# Patient Record
Sex: Female | Born: 1975 | Race: White | Hispanic: No | Marital: Married | Smoking: Never smoker
Health system: Southern US, Community
[De-identification: ages and names within clinical notes are randomized; demographics above are authoritative.]

## PROBLEM LIST (undated history)

## (undated) ENCOUNTER — Inpatient Hospital Stay (HOSPITAL_COMMUNITY): Payer: Self-pay

## (undated) DIAGNOSIS — R51 Headache: Secondary | ICD-10-CM

## (undated) DIAGNOSIS — T7840XA Allergy, unspecified, initial encounter: Secondary | ICD-10-CM

## (undated) DIAGNOSIS — M1990 Unspecified osteoarthritis, unspecified site: Secondary | ICD-10-CM

## (undated) DIAGNOSIS — I10 Essential (primary) hypertension: Secondary | ICD-10-CM

## (undated) DIAGNOSIS — I2699 Other pulmonary embolism without acute cor pulmonale: Secondary | ICD-10-CM

## (undated) DIAGNOSIS — F419 Anxiety disorder, unspecified: Secondary | ICD-10-CM

## (undated) DIAGNOSIS — D689 Coagulation defect, unspecified: Secondary | ICD-10-CM

## (undated) DIAGNOSIS — O09529 Supervision of elderly multigravida, unspecified trimester: Secondary | ICD-10-CM

## (undated) DIAGNOSIS — A630 Anogenital (venereal) warts: Secondary | ICD-10-CM

## (undated) DIAGNOSIS — Z1589 Genetic susceptibility to other disease: Secondary | ICD-10-CM

## (undated) DIAGNOSIS — K219 Gastro-esophageal reflux disease without esophagitis: Secondary | ICD-10-CM

## (undated) DIAGNOSIS — E7212 Methylenetetrahydrofolate reductase deficiency: Secondary | ICD-10-CM

## (undated) DIAGNOSIS — K644 Residual hemorrhoidal skin tags: Secondary | ICD-10-CM

## (undated) DIAGNOSIS — F329 Major depressive disorder, single episode, unspecified: Secondary | ICD-10-CM

## (undated) DIAGNOSIS — F32A Depression, unspecified: Secondary | ICD-10-CM

## (undated) HISTORY — DX: Anxiety disorder, unspecified: F41.9

## (undated) HISTORY — DX: Major depressive disorder, single episode, unspecified: F32.9

## (undated) HISTORY — DX: Coagulation defect, unspecified: D68.9

## (undated) HISTORY — DX: Depression, unspecified: F32.A

## (undated) HISTORY — DX: Residual hemorrhoidal skin tags: K64.4

## (undated) HISTORY — PX: WISDOM TOOTH EXTRACTION: SHX21

## (undated) HISTORY — DX: Essential (primary) hypertension: I10

## (undated) HISTORY — DX: Allergy, unspecified, initial encounter: T78.40XA

## (undated) HISTORY — DX: Genetic susceptibility to other disease: Z15.89

## (undated) HISTORY — DX: Unspecified osteoarthritis, unspecified site: M19.90

## (undated) HISTORY — DX: Methylenetetrahydrofolate reductase deficiency: E72.12

## (undated) HISTORY — DX: Anogenital (venereal) warts: A63.0

## (undated) HISTORY — DX: Supervision of elderly multigravida, unspecified trimester: O09.529

---

## 1999-06-13 ENCOUNTER — Other Ambulatory Visit: Admission: RE | Admit: 1999-06-13 | Discharge: 1999-06-13 | Payer: Self-pay | Admitting: *Deleted

## 1999-07-20 ENCOUNTER — Encounter (INDEPENDENT_AMBULATORY_CARE_PROVIDER_SITE_OTHER): Payer: Self-pay | Admitting: Specialist

## 1999-07-20 ENCOUNTER — Other Ambulatory Visit: Admission: RE | Admit: 1999-07-20 | Discharge: 1999-07-20 | Payer: Self-pay | Admitting: *Deleted

## 1999-11-22 ENCOUNTER — Encounter: Admission: RE | Admit: 1999-11-22 | Discharge: 1999-11-22 | Payer: Self-pay | Admitting: Urology

## 1999-11-22 ENCOUNTER — Encounter: Payer: Self-pay | Admitting: Urology

## 2000-01-23 ENCOUNTER — Other Ambulatory Visit: Admission: RE | Admit: 2000-01-23 | Discharge: 2000-01-23 | Payer: Self-pay | Admitting: *Deleted

## 2000-06-13 ENCOUNTER — Other Ambulatory Visit: Admission: RE | Admit: 2000-06-13 | Discharge: 2000-06-13 | Payer: Self-pay | Admitting: *Deleted

## 2000-10-08 ENCOUNTER — Other Ambulatory Visit: Admission: RE | Admit: 2000-10-08 | Discharge: 2000-10-08 | Payer: Self-pay | Admitting: Obstetrics and Gynecology

## 2001-06-17 ENCOUNTER — Other Ambulatory Visit: Admission: RE | Admit: 2001-06-17 | Discharge: 2001-06-17 | Payer: Self-pay | Admitting: Obstetrics and Gynecology

## 2002-06-19 ENCOUNTER — Other Ambulatory Visit: Admission: RE | Admit: 2002-06-19 | Discharge: 2002-06-19 | Payer: Self-pay | Admitting: Obstetrics and Gynecology

## 2003-02-27 DIAGNOSIS — I2699 Other pulmonary embolism without acute cor pulmonale: Secondary | ICD-10-CM

## 2003-02-27 HISTORY — DX: Other pulmonary embolism without acute cor pulmonale: I26.99

## 2003-05-03 ENCOUNTER — Inpatient Hospital Stay (HOSPITAL_COMMUNITY): Admission: EM | Admit: 2003-05-03 | Discharge: 2003-05-05 | Payer: Self-pay | Admitting: Internal Medicine

## 2003-06-23 ENCOUNTER — Other Ambulatory Visit: Admission: RE | Admit: 2003-06-23 | Discharge: 2003-06-23 | Payer: Self-pay | Admitting: Obstetrics and Gynecology

## 2005-01-11 IMAGING — CT CT CHEST W/ CM
1 of 3 series · 15 of 30 positions shown, 19 images · IV contrast (omnipaque)
Comparison: none

CLINICAL DATA: Chest pain and shortness of breath.  Evaluate for pulmonary embolism.
 CT CHEST WITH CONTRAST 
 Multidetector helical CT of the chest was performed following the pulmonary embolism protocol.  150 cc of Omnipaque 300 intravenous contrast was administered.  
 Satisfactory opacification of the pulmonary arteries is seen, and there is no evidence of acute pulmonary embolism.  The other hilar and mediastinal structures are unremarkable.  There is no evidence of masses or adenopathy.  There is no evidence of thoracic aortic aneurysm or dissection.  
 A tiny right pleural effusion is seen.  There is also mild dependent atelectasis in the right lower lobe.  There is no evidence of pulmonary airspace disease.  There is no evidence of mass or suspicious pulmonary nodules. 
 IMPRESSION
 1.  No CT evidence of acute pulmonary embolism. 
 2.  Tiny right pleural effusion and mild dependent right lower lobe atelectasis.

[Series 4: chest/pe 1.0 b10f · axial · 0.60mm/px · z∈[-312,-111]mm · 15 of 227 slices shown, 19 images]
[im 13/227  mediastinal]
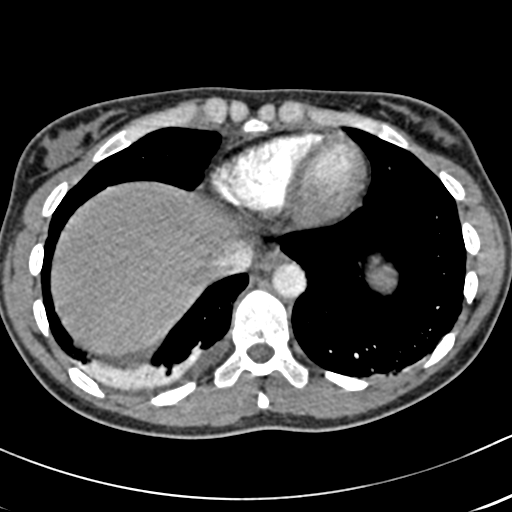
[im 13/227  lung]
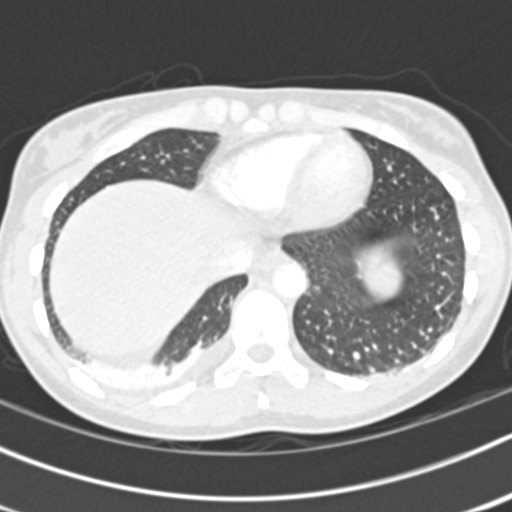
[im 26/227  lung]
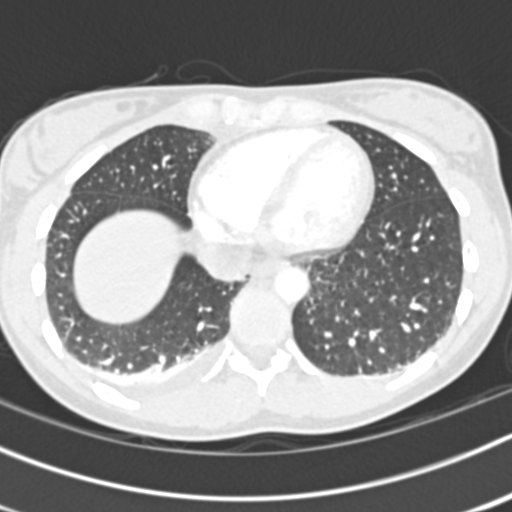
[im 51/227  lung]
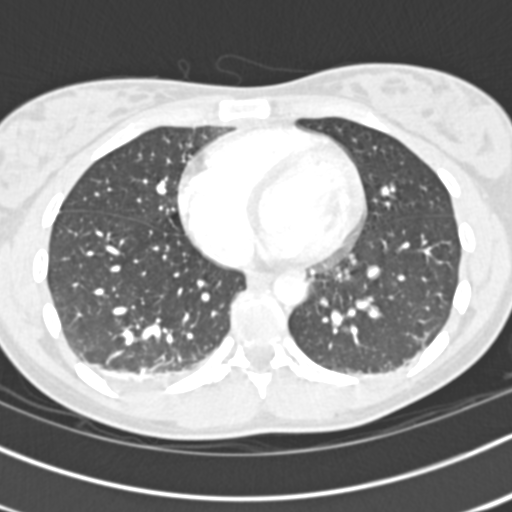
[im 52/227  lung]
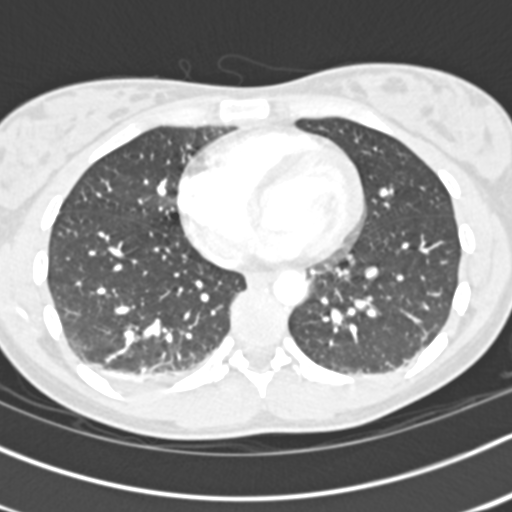
[im 63/227  mediastinal]
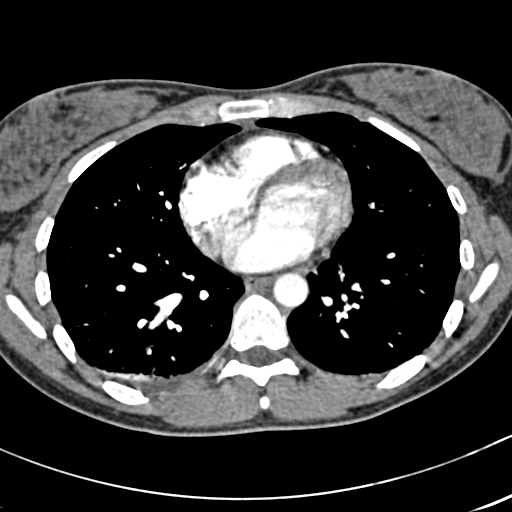
[im 63/227  lung]
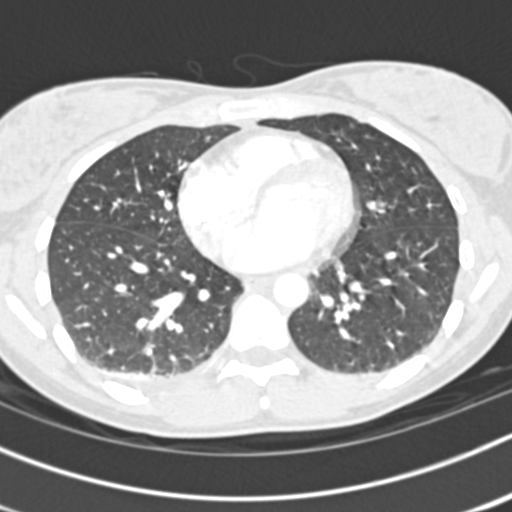
[im 76/227  lung]
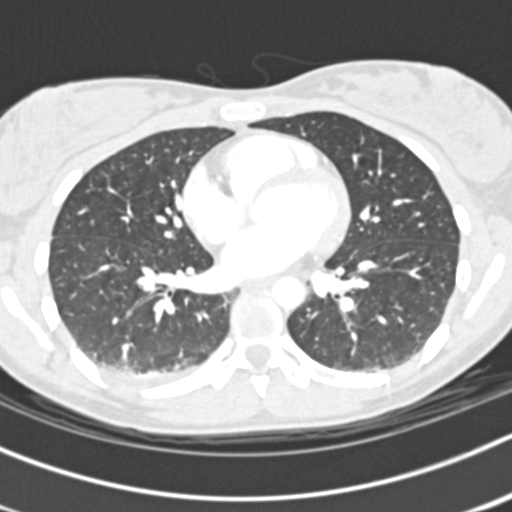
[im 88/227  lung]
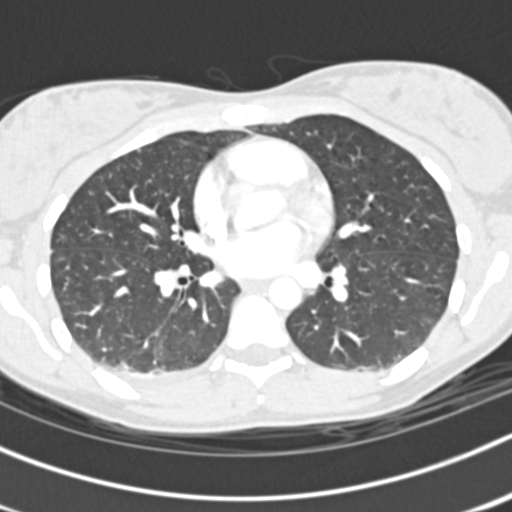
[im 114/227  lung]
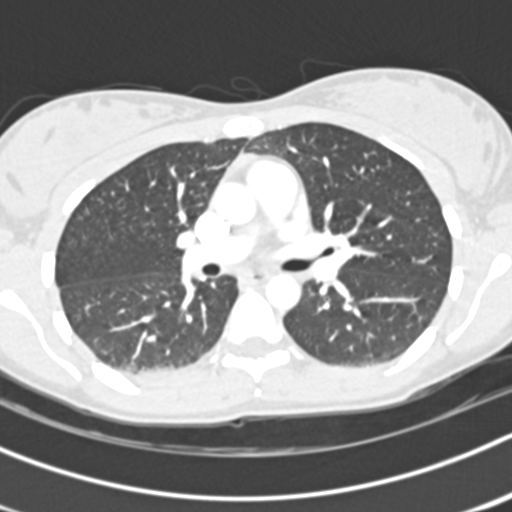
[im 126/227  mediastinal]
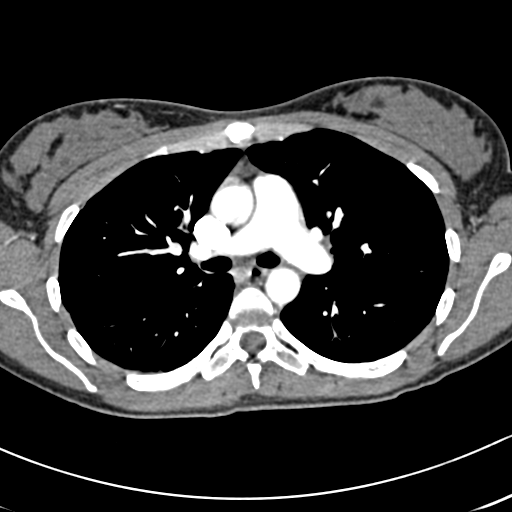
[im 126/227  lung]
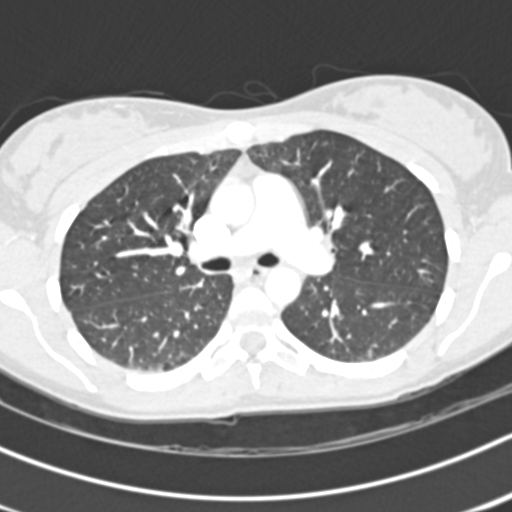
[im 139/227  lung]
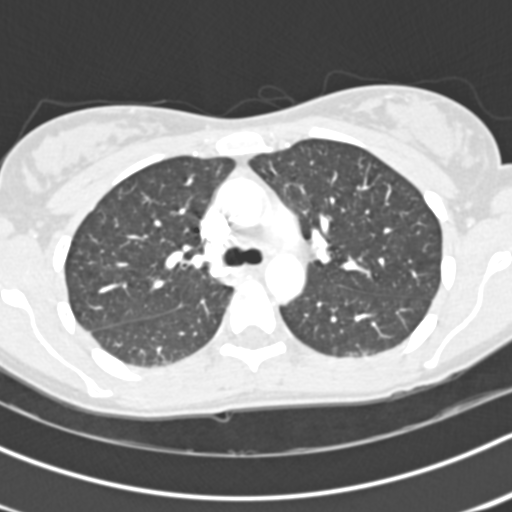
[im 151/227  lung]
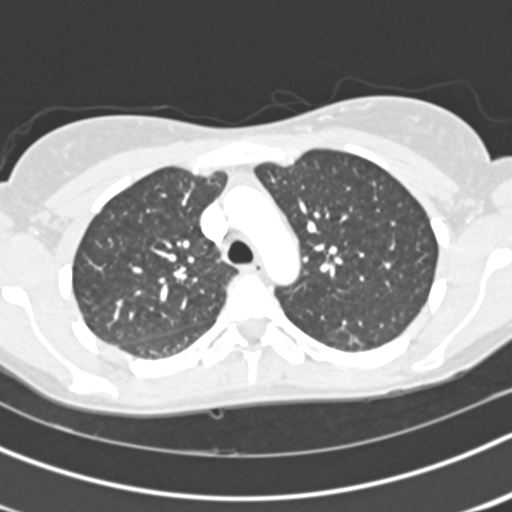
[im 164/227  lung]
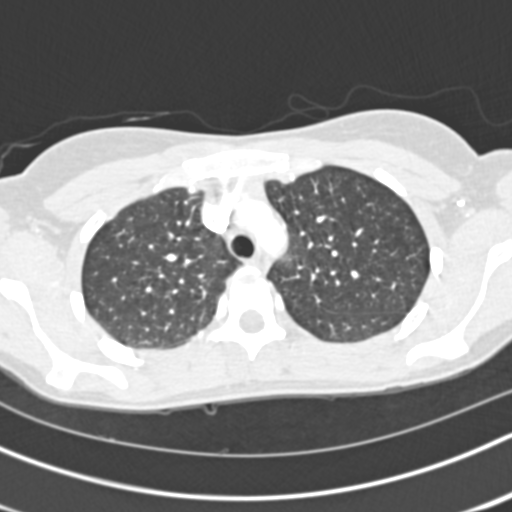
[im 189/227  mediastinal]
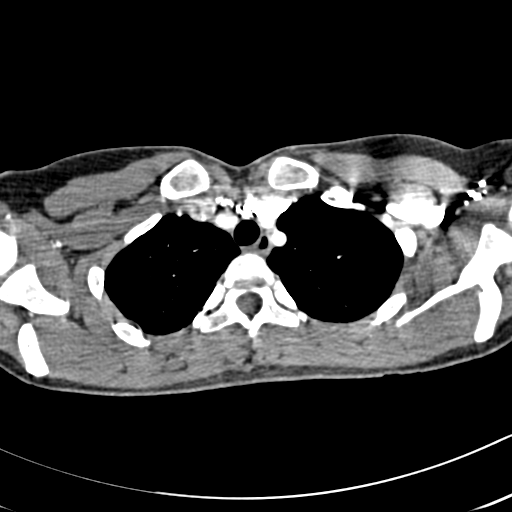
[im 189/227  lung]
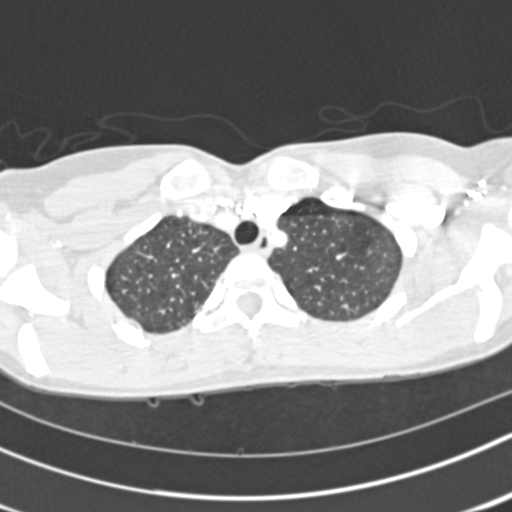
[im 201/227  lung]
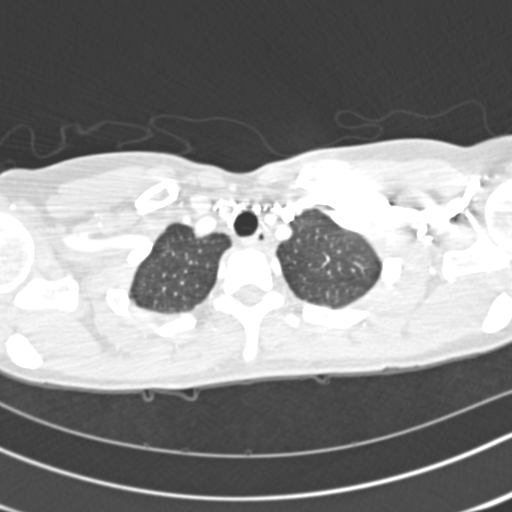
[im 214/227  lung]
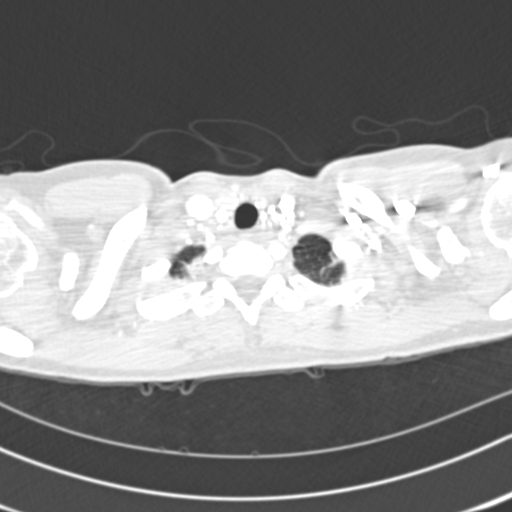

[15 of 30 positions shown; findings below may reference images not displayed]

## 2006-04-24 ENCOUNTER — Ambulatory Visit: Payer: Self-pay | Admitting: Oncology

## 2006-04-30 LAB — CBC WITH DIFFERENTIAL/PLATELET
BASO%: 0.7 % (ref 0.0–2.0)
EOS%: 0.7 % (ref 0.0–7.0)
HCT: 38.1 % (ref 34.8–46.6)
LYMPH%: 27.9 % (ref 14.0–48.0)
MCH: 31.7 pg (ref 26.0–34.0)
MCHC: 35.2 g/dL (ref 32.0–36.0)
NEUT%: 63.1 % (ref 39.6–76.8)
Platelets: 239 10*3/uL (ref 145–400)
RBC: 4.24 10*6/uL (ref 3.70–5.32)

## 2006-04-30 LAB — MORPHOLOGY: RBC Comments: NORMAL

## 2006-05-03 LAB — BETA-2 GLYCOPROTEIN ANTIBODIES: Beta-2-Glycoprotein I IgA: 7 U/mL (ref <10)

## 2006-05-03 LAB — APTT: aPTT: 29 seconds (ref 24–37)

## 2006-05-03 LAB — COMPREHENSIVE METABOLIC PANEL
BUN: 15 mg/dL (ref 6–23)
CO2: 24 mEq/L (ref 19–32)
Creatinine, Ser: 0.92 mg/dL (ref 0.40–1.20)
Glucose, Bld: 101 mg/dL — ABNORMAL HIGH (ref 70–99)
Total Bilirubin: 0.4 mg/dL (ref 0.3–1.2)

## 2006-05-03 LAB — CARDIOLIPIN ANTIBODIES, IGG, IGM, IGA: Anticardiolipin IgA: 8 [APL'U] (ref <13)

## 2006-05-03 LAB — LUPUS ANTICOAGULANT PANEL
DRVVT: 38.9 secs (ref 36.1–47.0)
PTT Lupus Anticoagulant: 47.4 secs (ref 36.3–48.8)

## 2006-05-03 LAB — LACTATE DEHYDROGENASE: LDH: 109 U/L (ref 94–250)

## 2006-05-03 LAB — FIBRINOGEN: Fibrinogen: 311 mg/dL (ref 204–475)

## 2006-05-03 LAB — PROTHROMBIN TIME
INR: 1 (ref 0.0–1.5)
Prothrombin Time: 13 seconds (ref 11.6–15.2)

## 2006-05-03 LAB — D-DIMER, QUANTITATIVE: D-Dimer, Quant: 0.37 ug/mL-FEU (ref 0.00–0.48)

## 2006-10-20 ENCOUNTER — Ambulatory Visit: Payer: Self-pay | Admitting: Oncology

## 2006-10-22 LAB — CBC WITH DIFFERENTIAL/PLATELET
Eosinophils Absolute: 0.1 10*3/uL (ref 0.0–0.5)
LYMPH%: 25.4 % (ref 14.0–48.0)
MONO#: 0.6 10*3/uL (ref 0.1–0.9)
NEUT#: 4.9 10*3/uL (ref 1.5–6.5)
Platelets: 272 10*3/uL (ref 145–400)
RBC: 4.07 10*6/uL (ref 3.70–5.32)
WBC: 7.5 10*3/uL (ref 3.9–10.0)

## 2006-10-22 LAB — HEPARIN ANTI-XA: Heparin LMW: <0.1 IU/mL

## 2007-03-31 ENCOUNTER — Ambulatory Visit: Payer: Self-pay | Admitting: Oncology

## 2007-04-02 LAB — CBC WITH DIFFERENTIAL/PLATELET
BASO%: 0.4 % (ref 0.0–2.0)
Basophils Absolute: 0 10*3/uL (ref 0.0–0.1)
EOS%: 1 % (ref 0.0–7.0)
HGB: 12.6 g/dL (ref 11.6–15.9)
MCH: 31.4 pg (ref 26.0–34.0)
MCHC: 34.2 g/dL (ref 32.0–36.0)
MCV: 91.8 fL (ref 81.0–101.0)
MONO%: 7.3 % (ref 0.0–13.0)
RDW: 13.3 % (ref 11.3–14.5)
lymph#: 1.6 10*3/uL (ref 0.9–3.3)

## 2007-04-02 LAB — HEPARIN ANTI-XA: Heparin LMW: <0.1 IU/mL

## 2007-05-24 ENCOUNTER — Inpatient Hospital Stay (HOSPITAL_COMMUNITY): Admission: AD | Admit: 2007-05-24 | Discharge: 2007-05-28 | Payer: Self-pay | Admitting: Obstetrics and Gynecology

## 2007-05-25 ENCOUNTER — Ambulatory Visit: Payer: Self-pay | Admitting: Oncology

## 2007-06-06 ENCOUNTER — Ambulatory Visit: Payer: Self-pay | Admitting: Oncology

## 2009-07-21 ENCOUNTER — Ambulatory Visit: Payer: Self-pay | Admitting: Oncology

## 2009-07-22 LAB — CBC WITH DIFFERENTIAL/PLATELET
BASO%: 0.6 % (ref 0.0–2.0)
Basophils Absolute: 0 10*3/uL (ref 0.0–0.1)
EOS%: 1 % (ref 0.0–7.0)
Eosinophils Absolute: 0.1 10*3/uL (ref 0.0–0.5)
HCT: 39.1 % (ref 34.8–46.6)
HGB: 13.3 g/dL (ref 11.6–15.9)
LYMPH%: 26.7 % (ref 14.0–49.7)
MCH: 31.5 pg (ref 25.1–34.0)
MCHC: 34.1 g/dL (ref 31.5–36.0)
MCV: 92.5 fL (ref 79.5–101.0)
MONO#: 0.4 10*3/uL (ref 0.1–0.9)
MONO%: 5.8 % (ref 0.0–14.0)
NEUT#: 4.3 10*3/uL (ref 1.5–6.5)
NEUT%: 65.9 % (ref 38.4–76.8)
Platelets: 275 10*3/uL (ref 145–400)
RBC: 4.23 10*6/uL (ref 3.70–5.45)
RDW: 12.6 % (ref 11.2–14.5)
WBC: 6.5 10*3/uL (ref 3.9–10.3)
lymph#: 1.7 10*3/uL (ref 0.9–3.3)

## 2009-07-26 LAB — LUPUS ANTICOAGULANT PANEL
DRVVT: 42.1 secs (ref 36.2–44.3)
Lupus Anticoagulant: NOT DETECTED
PTT Lupus Anticoagulant: 41.8 secs (ref 32.0–43.4)

## 2009-07-26 LAB — COMPREHENSIVE METABOLIC PANEL
ALT: 10 U/L (ref 0–35)
AST: 13 U/L (ref 0–37)
Albumin: 4.5 g/dL (ref 3.5–5.2)
Alkaline Phosphatase: 51 U/L (ref 39–117)
BUN: 14 mg/dL (ref 6–23)
CO2: 24 mEq/L (ref 19–32)
Calcium: 9.6 mg/dL (ref 8.4–10.5)
Chloride: 100 mEq/L (ref 96–112)
Creatinine, Ser: 0.78 mg/dL (ref 0.40–1.20)
Glucose, Bld: 82 mg/dL (ref 70–99)
Potassium: 3.7 mEq/L (ref 3.5–5.3)
Sodium: 136 mEq/L (ref 135–145)
Total Bilirubin: 0.3 mg/dL (ref 0.3–1.2)
Total Protein: 7.1 g/dL (ref 6.0–8.3)

## 2009-07-26 LAB — PROTHROMBIN TIME
INR: 1.07 (ref <1.50)
Prothrombin Time: 13.8 seconds (ref 11.6–15.2)

## 2009-07-26 LAB — ANA: Anti Nuclear Antibody(ANA): POSITIVE — AB

## 2009-07-26 LAB — APTT: aPTT: 34 seconds (ref 24–37)

## 2009-07-26 LAB — LACTATE DEHYDROGENASE: LDH: 114 U/L (ref 94–250)

## 2009-12-28 ENCOUNTER — Ambulatory Visit: Payer: Self-pay | Admitting: Oncology

## 2009-12-30 LAB — CBC WITH DIFFERENTIAL/PLATELET
Basophils Absolute: 0 10*3/uL (ref 0.0–0.1)
Eosinophils Absolute: 0.1 10*3/uL (ref 0.0–0.5)
HCT: 33.3 % — ABNORMAL LOW (ref 34.8–46.6)
LYMPH%: 22.6 % (ref 14.0–49.7)
MCV: 94 fL (ref 79.5–101.0)
MONO%: 6.7 % (ref 0.0–14.0)
NEUT#: 4.8 10*3/uL (ref 1.5–6.5)
NEUT%: 69.3 % (ref 38.4–76.8)
Platelets: 230 10*3/uL (ref 145–400)
RBC: 3.54 10*6/uL — ABNORMAL LOW (ref 3.70–5.45)

## 2009-12-30 LAB — D-DIMER, QUANTITATIVE: D-Dimer, Quant: 0.45 ug/mL-FEU (ref 0.00–0.48)

## 2010-04-24 ENCOUNTER — Encounter (HOSPITAL_BASED_OUTPATIENT_CLINIC_OR_DEPARTMENT_OTHER): Payer: BC Managed Care – PPO | Admitting: Oncology

## 2010-04-24 ENCOUNTER — Other Ambulatory Visit: Payer: Self-pay | Admitting: Oncology

## 2010-04-24 DIAGNOSIS — O99891 Other specified diseases and conditions complicating pregnancy: Secondary | ICD-10-CM

## 2010-04-24 DIAGNOSIS — Z86718 Personal history of other venous thrombosis and embolism: Secondary | ICD-10-CM

## 2010-04-24 DIAGNOSIS — Z7901 Long term (current) use of anticoagulants: Secondary | ICD-10-CM

## 2010-04-24 LAB — CBC WITH DIFFERENTIAL/PLATELET
Basophils Absolute: 0.1 10*3/uL (ref 0.0–0.1)
EOS%: 0.7 % (ref 0.0–7.0)
Eosinophils Absolute: 0.1 10*3/uL (ref 0.0–0.5)
HCT: 35.8 % (ref 34.8–46.6)
HGB: 12.4 g/dL (ref 11.6–15.9)
LYMPH%: 19 % (ref 14.0–49.7)
MCH: 32.3 pg (ref 25.1–34.0)
MCV: 92.8 fL (ref 79.5–101.0)
MONO%: 8.8 % (ref 0.0–14.0)
NEUT#: 5.3 10*3/uL (ref 1.5–6.5)
NEUT%: 70.3 % (ref 38.4–76.8)
Platelets: 187 10*3/uL (ref 145–400)

## 2010-05-14 ENCOUNTER — Inpatient Hospital Stay (HOSPITAL_COMMUNITY)
Admission: AD | Admit: 2010-05-14 | Discharge: 2010-05-16 | DRG: 373 | Disposition: A | Discharge status: Home / Self Care | Payer: BC Managed Care – PPO | Source: Ambulatory Visit | Attending: Obstetrics and Gynecology | Admitting: Obstetrics and Gynecology

## 2010-05-14 LAB — PROTIME-INR
INR: 0.97 (ref 0.00–1.49)
Prothrombin Time: 13.1 seconds (ref 11.6–15.2)

## 2010-05-14 LAB — APTT: aPTT: 27 seconds (ref 24–37)

## 2010-05-14 LAB — CBC
HCT: 36.3 % (ref 36.0–46.0)
Hemoglobin: 12.5 g/dL (ref 12.0–15.0)
MCHC: 34.4 g/dL (ref 30.0–36.0)
MCV: 92.1 fL (ref 78.0–100.0)
RDW: 13.5 % (ref 11.5–15.5)

## 2010-05-15 LAB — CBC
HCT: 36 % (ref 36.0–46.0)
MCH: 30.9 pg (ref 26.0–34.0)
MCHC: 33.1 g/dL (ref 30.0–36.0)
MCV: 93.5 fL (ref 78.0–100.0)
Platelets: 159 10*3/uL (ref 150–400)
RDW: 13.7 % (ref 11.5–15.5)
WBC: 11.7 10*3/uL — ABNORMAL HIGH (ref 4.0–10.5)

## 2010-05-18 NOTE — H&P (Signed)
  NAMERICKETTA, Erin Hays             ACCOUNT NO.:  [id number]  MEDICAL RECORD NO.:  [id number]           PATIENT TYPE:  I  LOCATION:  9168                          FACILITY:  [facility]  PHYSICIAN:  Lenoard Aden, M.D.DATE OF BIRTH:  [birth year]  DATE OF ADMISSION:  05/14/2010 DATE OF DISCHARGE:                             HISTORY & PHYSICAL   CHIEF COMPLAINT:  Spontaneous rupture of membranes.  HISTORY OF PRESENT ILLNESS:  She is a 34 year old white female G4, P1-0- 2-1 who presents at 37+ weeks gestation with spontaneous rupture of membranes.  The patient had called last night with questionable trickle of fluid which had stopped which happened approximately at 6:30.  She elected to stay at home, was told to lay down and called back for any further leakage of fluid.  At that time, she did note further leakage of fluid, however, leakage of fluid was obvious with a gush of fluid at 6:30 this morning.  She presents now to [facility] at 10 a.m. for induction.  ALLERGIES:  She has no known drug allergies, noted LATEX allergies.  PAST MEDICAL HISTORY:  Remarkable for a pulmonary embolism.  She has a history of condyloma, anxiety, depression, migraine headaches.  FAMILY HISTORY:  Heart disease, brain tumor, kidney stones, chronic hypertension, alcohol abuse, mini strokes and myocardial infarction. She has a history of an uncomplicated vaginal delivery 6 pounds 9 ounces female in 2009 and an SAB x2.  She is a nonsmoker, nondrinker.  She denies domestic or physical violence.  PREGNANCY HISTORY:  Remarkable for history of pulmonary embolism on low dose of thromboprophylaxis with Lovenox managed by Dr. Cyndie Chime and GBS bacteria treated once in the first trimester and repeat treatment for symptomatic UTI which grew out GBS in the third trimester.  PHYSICAL EXAMINATION:  GENERAL:  Well-developed, well-nourished white female in no acute distress. HEENT:  Normal. NECK:  Supple, full  range of motion. LUNGS:  Clear. HEART:  Regular rate and rhythm. ABDOMEN:  Soft, gravid and nontender.  Estimated fetal weight 7 pounds. Cervix is 2, 50%, vertex -1. EXTREMITIES:  No cords. NEUROLOGIC:  Nonfocal. SKIN:  Intact.  Fetal heart rate tracing is reactive category 1 tracing as noted.  IMPRESSION: 1. A 37-week intrauterine pregnancy with spontaneous rupture of     membranes. 2. History of pulmonary embolism on Lovenox, last dose on May 12, 2010.  The patient held her dose last night due to questionable     leakage of fluid. 3. Group B streptococcus bacteria.  PLAN:  Admit.  We will administer Pitocin for labor augmentation, epidural as needed.  Anticipate attempts at vaginal delivery.     Lenoard Aden, M.D.     RJT/MEDQ  D:  05/14/2010  T:  05/14/2010  Job:  [id number]  cc:   [facility]  Electronically Signed by Olivia Mackie M.D. on 05/18/2010 01:38:24 PM

## 2010-06-03 ENCOUNTER — Inpatient Hospital Stay (HOSPITAL_COMMUNITY): Admission: AD | Admit: 2010-06-03 | Payer: Self-pay | Admitting: Obstetrics and Gynecology

## 2010-07-11 NOTE — H&P (Signed)
Erin Hays, Erin Hays             ACCOUNT NO.:  [id number]   MEDICAL RECORD NO.:  [id number]          PATIENT TYPE:  INP   LOCATION:  9167                          FACILITY:  [facility]   PHYSICIAN:  Lenoard Aden, M.D.DATE OF BIRTH:  05/30/1975   DATE OF ADMISSION:  05/24/2007  DATE OF DISCHARGE:                              HISTORY & PHYSICAL   CHIEF COMPLAINT:  History of pulmonary embolus, on Lovenox prophylaxis  for cervical ripening and induction.  She is a 35 year old white female  G2, P0 at [redacted] weeks gestation who presents for cervical ripening and  induction.  She has a history of Lovenox use, history of elevated blood  pressure this pregnancy which responded to bedrest.  She has not had any  problems on her Lovenox this pregnancy.   ALLERGIES:  SHE HAS NO KNOWN DRUG ALLERGIES.   MEDICATIONS:  Prenatal vitamins and Lovenox last taken 24 hours ago.   She has a history of HPV and colposcopy.   She is a nonsmoker, nondrinker.  She denies domestic physical violence.   FAMILY HISTORY:  Myocardial infarction, emphysema, kidney stones,  alcohol abuse.   Previous history of spontaneous pregnancy loss.   On physical exam she is a well-developed, well-nourished white female in  no acute distress.  HEENT:  Normal.  LUNGS:  Clear.  HEART:  Regular rhythm.  ABDOMEN:  Soft, gravid, nontender.  Estimated fetal weight is 7-1/2  pounds.  Cervix is closed.  __________ vertex -2.  EXTREMITIES:  Felt no cords.  NEUROLOGIC EXAM:  Nonfocal.  SKIN:  Intact.   IMPRESSION:  1. A 38-week intrauterine pregnancy.  2. History of gestational hypertension, stable on bedrest.  3. Group B streptococcus positive.  4. History of pulmonary embolism, on Lovenox.   PLAN:  Proceed with induction.  Cervidil, Pitocin in a.m.  The risks,  benefits discussed.  Small risk of prematurity noted.  The patient  acknowledges and wishes to proceed.      Lenoard Aden, M.D.  Electronically  Signed     RJT/MEDQ  D:  05/24/2007  T:  05/24/2007  Job:  [id number]

## 2010-07-14 NOTE — Discharge Summary (Signed)
NAME:  Erin Hays, Erin Hays                   ACCOUNT NO.:  [id number]   MEDICAL RECORD NO.:  [id number]                   PATIENT TYPE:  INP   LOCATION:  0367                                 FACILITY:  [facility]   PHYSICIAN:  Charlaine Dalton. Sherene Sires, M.D. [facility]           DATE OF BIRTH:  09/14/1975   DATE OF ADMISSION:  05/03/2003  DATE OF DISCHARGE:  05/05/2003                                 DISCHARGE SUMMARY   FINAL DIAGNOSIS:  1. Acute pleuritic pain associated with small pleural effusion consistent     with pulmonary embolism;     a. Spiral CT scan this admission non-diagnostic for pulmonary embolism;     b. Venous Dopplers of lower extremities also negative this admission;     c. Empiric anticoagulation felt to be indicated based on the high risk        that this represented a small peripheral pulmonary embolism.  2. Birth control pill use.   BRIEF HISTORY:  Please see dictated history and physical for details, but  she developed the abrupt onset of anterior chest pain that radiated to her  back, intensely pleuritic, worse lying down and was evaluated as an  outpatient by Dr. Theresia Lo with a poor contrasted CT scan.   HOSPITAL COURSE:  I admitted her to the hospital to repeat the CT scan,  hydrate her overnight and documented that she had normal renal function.  Her studies were remarkable for a normal spiral CT scan, a borderline  elevated D-dimer and a normal abdominal ultrasound.  Therefore, we had no  other explanation for her intense pleuritic pain and small pleural effusion  and I reasoned that she probably had a very small peripheral embolus that  might have been missed even if we had proceeded to an arteriogram.   She was feeling better after empiric therapy with IV heparin for 24 hours  and then switched over to Lovenox. I believe the risks versus benefit of  doing additional procedures is strongly in favor of simply anticoagulating  this patient for a minimum of six weeks and a  maximum of six months and  stopping her birth control pills at this point.   DISPOSITION:  She is discharged in improved condition.   DISCHARGE MEDICATIONS:  1. Coumadin 5 mg daily.  2. Ultram 60 mg one q.6h p.r.n. pain.   FOLLOW UP:  1. We will plan to see her in the Coumadin clinic in five days to recheck     her Protime.  2. I will see her in a week to recheck her pleural effusion.  If the pleural     effusion worsens on therapy, I would rethink the diagnosis and feel that     a parapneumonic process would be more likely.  However, based on the     absence of an elevated white count, fever or any symptoms even remotely     suggestive of pneumonia, I think a parapneumonic effusion is unlikely.  The rheumatologic profile and hypercoagulability profile are still     pending at the time of this dictation and will be checked when she     returns to the office for follow-up.                                               Charlaine Dalton. Sherene Sires, M.D. [facility]    MBW/MEDQ  D:  05/05/2003  T:  05/06/2003  Job:  [id number]   cc:   Vikki Ports, M.D.  [address] [place]  [place]  [place] [address]  Fax: [phone]

## 2010-11-20 LAB — CBC
HCT: 33.4 — ABNORMAL LOW
HCT: 35.7 — ABNORMAL LOW
Hemoglobin: 12.7
MCV: 94.3
Platelets: 153
RDW: 13.7
WBC: 10.2

## 2012-12-02 LAB — OB RESULTS CONSOLE RPR: RPR: NONREACTIVE

## 2012-12-02 LAB — OB RESULTS CONSOLE HIV ANTIBODY (ROUTINE TESTING): HIV: NONREACTIVE

## 2012-12-02 LAB — OB RESULTS CONSOLE ANTIBODY SCREEN: Antibody Screen: NEGATIVE

## 2012-12-02 LAB — OB RESULTS CONSOLE ABO/RH: "RH Type ": POSITIVE

## 2012-12-02 LAB — OB RESULTS CONSOLE RUBELLA ANTIBODY, IGM: Rubella: NON-IMMUNE/NOT IMMUNE

## 2012-12-02 LAB — OB RESULTS CONSOLE HEPATITIS B SURFACE ANTIGEN: HEP B S AG: NEGATIVE

## 2012-12-29 LAB — OB RESULTS CONSOLE GC/CHLAMYDIA
Chlamydia: NEGATIVE
Gonorrhea: NEGATIVE

## 2013-02-26 NOTE — L&D Delivery Note (Signed)
Delivery Note At 11:43 PM a viable and healthy female was delivered via Vaginal, Spontaneous Delivery (Presentation: LoA  ).  APGAR: 8, 9; weight pending.   Placenta status: spontaneous, intact.  Cord:  with the following complications: none.  Cord pH: na  Anesthesia: Epidural  Episiotomy: none Lacerations: second Suture Repair: 2.0 vicryl rapide Est. Blood Loss (mL): 200  Mom to postpartum.  Baby to Couplet care / Skin to Skin.  Kitt Ledet J Jaevon Paras 07/01/2013, 11:52 PM

## 2013-05-05 LAB — OB RESULTS CONSOLE GBS: GBS: POSITIVE

## 2013-06-19 ENCOUNTER — Inpatient Hospital Stay (HOSPITAL_COMMUNITY)
Admission: AD | Admit: 2013-06-19 | Discharge: 2013-06-19 | Disposition: A | Discharge status: Home / Self Care | Payer: 59 | Source: Ambulatory Visit | Attending: Obstetrics and Gynecology | Admitting: Obstetrics and Gynecology

## 2013-06-19 ENCOUNTER — Encounter (HOSPITAL_COMMUNITY): Payer: Self-pay | Admitting: General Practice

## 2013-06-19 DIAGNOSIS — F29 Unspecified psychosis not due to a substance or known physiological condition: Secondary | ICD-10-CM | POA: Insufficient documentation

## 2013-06-19 DIAGNOSIS — E162 Hypoglycemia, unspecified: Secondary | ICD-10-CM | POA: Insufficient documentation

## 2013-06-19 DIAGNOSIS — K219 Gastro-esophageal reflux disease without esophagitis: Secondary | ICD-10-CM | POA: Insufficient documentation

## 2013-06-19 DIAGNOSIS — Z86718 Personal history of other venous thrombosis and embolism: Secondary | ICD-10-CM | POA: Insufficient documentation

## 2013-06-19 DIAGNOSIS — E161 Other hypoglycemia: Secondary | ICD-10-CM

## 2013-06-19 DIAGNOSIS — R42 Dizziness and giddiness: Secondary | ICD-10-CM | POA: Insufficient documentation

## 2013-06-19 DIAGNOSIS — O99891 Other specified diseases and conditions complicating pregnancy: Secondary | ICD-10-CM | POA: Insufficient documentation

## 2013-06-19 DIAGNOSIS — O9989 Other specified diseases and conditions complicating pregnancy, childbirth and the puerperium: Principal | ICD-10-CM

## 2013-06-19 HISTORY — DX: Headache: R51

## 2013-06-19 HISTORY — DX: Other pulmonary embolism without acute cor pulmonale: I26.99

## 2013-06-19 HISTORY — DX: Gastro-esophageal reflux disease without esophagitis: K21.9

## 2013-06-19 LAB — URINALYSIS, ROUTINE W REFLEX MICROSCOPIC
Bilirubin Urine: NEGATIVE
Glucose, UA: NEGATIVE mg/dL
Hgb urine dipstick: NEGATIVE
Ketones, ur: NEGATIVE mg/dL
Leukocytes, UA: NEGATIVE
Nitrite: NEGATIVE
Protein, ur: NEGATIVE mg/dL
Specific Gravity, Urine: 1.01 (ref 1.005–1.030)
Urobilinogen, UA: 0.2 mg/dL (ref 0.0–1.0)
pH: 6 (ref 5.0–8.0)

## 2013-06-19 LAB — PROTIME-INR
INR: 0.92 (ref 0.00–1.49)
Prothrombin Time: 12.2 seconds (ref 11.6–15.2)

## 2013-06-19 LAB — COMPREHENSIVE METABOLIC PANEL
ALT: 11 U/L (ref 0–35)
AST: 15 U/L (ref 0–37)
Albumin: 2.3 g/dL — ABNORMAL LOW (ref 3.5–5.2)
Alkaline Phosphatase: 286 U/L — ABNORMAL HIGH (ref 39–117)
BUN: 13 mg/dL (ref 6–23)
CO2: 21 mEq/L (ref 19–32)
Calcium: 9 mg/dL (ref 8.4–10.5)
Chloride: 98 mEq/L (ref 96–112)
Creatinine, Ser: 0.73 mg/dL (ref 0.50–1.10)
GFR calc Af Amer: >90 mL/min (ref >90)
GFR calc non Af Amer: >90 mL/min (ref >90)
Glucose, Bld: 126 mg/dL — ABNORMAL HIGH (ref 70–99)
Potassium: 3.9 mEq/L (ref 3.7–5.3)
Sodium: 134 mEq/L — ABNORMAL LOW (ref 137–147)
Total Bilirubin: <0.2 mg/dL — ABNORMAL LOW (ref 0.3–1.2)
Total Protein: 6 g/dL (ref 6.0–8.3)

## 2013-06-19 MED ORDER — ACETAMINOPHEN 325 MG PO TABS
650.0000 mg | ORAL_TABLET | Freq: Once | ORAL | Status: AC
  Administered 2013-06-19: 650 mg via ORAL
  Filled 2013-06-19: qty 2

## 2013-06-19 NOTE — MAU Note (Signed)
Pt states a history of a PE in 2005 and has been on lovenox but was switched to heparin on Tuesday.

## 2013-06-19 NOTE — MAU Note (Signed)
Pt states a sudden bad headache about 7:00 PM at her sons t ball game that lasted about 20 minutes, then the headache started to wear off but then pt states she had trouble with her words, not being able to speak more than two, was not able to text, states her sons name didn't seem familiar to her. Pt states she had some dizziness at the game before the headache started. Pt. States she had eaten two pieces of pizza for lunch at 12:30 and had not yet eaten dinner by 7:00 when this happened. Pt states she ate at 8:00 pm but states that this did not improve they way she felt and that she was already improving before she ate.  Pt states her headache is starting to come back. Pt does state a history of migraines.

## 2013-06-19 NOTE — Discharge Instructions (Signed)
Low Blood Sugar Low blood sugar (hypoglycemia) means that the level of sugar in your blood is lower than it should be. Signs of low blood sugar include:  Feeling dizzy or weak.  Feeling sleepier than normal.  Feeling nervous and agitated.  Headaches.  Confusion.  Having a fast heartbeat.   Low blood sugar can happen fast and can be an emergency. Your doctor can do tests to check your blood sugar level. You can have low blood sugar and NOT have diabetes. HOME CARE  Do not skip meals.   Eat on time every 2-3 hours  - SMALL frequent meals.  Eat protein with each meal - will prevent quick rise in sugar followed by rapid drop. GET HELP RIGHT AWAY IF:   Are confused.  Are not able to swallow.  Pass out (faint).  You cannot treat yourself. You may need someone to help you.  You have low blood sugar problems often.  You are not feeling better after an hour after food intake.  You have vision changes. MAKE SURE YOU:   Understand these instructions.  Will watch this condition.  Will get help right away if you are not doing well or get worse.   Document Released: 05/09/2009 Document Revised: 05/07/2011 Document Reviewed: 05/09/2009 [facility] Patient Information 2014 [place] [place].

## 2013-06-19 NOTE — MAU Provider Note (Signed)
  History     CSN: [id number]  Arrival date and time: 06/19/13 2052 Provider in to evaluate patient @ 2130    Chief Complaint  Patient presents with  . Migraine  . Altered Mental Status   HPI  Nothing to eat or drink today- last intake 2100 - states not hungry at all anymore so didn't eat Did stop to eat on way to hospital Reported sitting at baseball game ~ 1930 started feeling "weird and spacey" - lightheaded and tired could not seem to focus or get the right words out when talking Per spouse -just not acting like herself thinks it may be related to her heparin Dull headache this afternoon - no medications taken. No vision changes or syncope.  Hx DVT - currently on Heparin BID - switched from Lovenox on Tuesday of this week  No symptoms at present - states feeling much better since arrival to hospital  No past medical history on file. 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  No family history on file.  History  Substance Use Topics  . Smoking status: Not on file  . Smokeless tobacco: Not on file  . Alcohol Use: Not on file    Allergies: No Known Allergies  No prescriptions prior to admission    ROS Active FM Irregular braxton-hicks contractions No LOF or discharge No SOB or chest pain No leg pain or edema / notes some carpal tunnel pain in right hand intermittently this week  Physical Exam   Last menstrual period 10/01/2012.  VS: pulse 91 / resp 18 / BP 129/84  Physical Exam Alert and oriented x 3  Remote and short term memory intake  Speech clear and appropriate / no stammering or stuttering or delay with words Normal gait and bilateral arm and leg strength Heart RRR Lungs clear and unlabored Abdomen soft and non-tender - active BS Uterus gravid and non-tender Deferred pelvic exam (no labor signs)  MAU Course  Procedures  NST - reactive from baseline 130 / no  decels / rare UC - mild  Labs: INR: 0.92 CMP: glucose (1 hr after food) 126 / LE normal / potassium 3.9 / creat 0.73 Urinalysis: NL  Assessment and Plan  37.[redacted] weeks pregnant with 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

## 2013-06-22 ENCOUNTER — Other Ambulatory Visit: Payer: Self-pay | Admitting: Obstetrics and Gynecology

## 2013-06-23 ENCOUNTER — Encounter (HOSPITAL_COMMUNITY): Payer: Self-pay | Admitting: *Deleted

## 2013-06-23 ENCOUNTER — Telehealth (HOSPITAL_COMMUNITY): Payer: Self-pay | Admitting: *Deleted

## 2013-06-23 NOTE — Telephone Encounter (Signed)
Preadmission screen  

## 2013-06-27 ENCOUNTER — Encounter (HOSPITAL_COMMUNITY): Payer: Self-pay | Admitting: Family

## 2013-06-27 ENCOUNTER — Inpatient Hospital Stay (HOSPITAL_COMMUNITY)
Admission: AD | Admit: 2013-06-27 | Discharge: 2013-06-27 | Disposition: A | Discharge status: Home / Self Care | Payer: 59 | Source: Ambulatory Visit | Attending: Obstetrics | Admitting: Obstetrics

## 2013-06-27 DIAGNOSIS — O99891 Other specified diseases and conditions complicating pregnancy: Secondary | ICD-10-CM | POA: Insufficient documentation

## 2013-06-27 DIAGNOSIS — K219 Gastro-esophageal reflux disease without esophagitis: Secondary | ICD-10-CM | POA: Insufficient documentation

## 2013-06-27 DIAGNOSIS — O09519 Supervision of elderly primigravida, unspecified trimester: Secondary | ICD-10-CM | POA: Insufficient documentation

## 2013-06-27 DIAGNOSIS — R030 Elevated blood-pressure reading, without diagnosis of hypertension: Secondary | ICD-10-CM | POA: Insufficient documentation

## 2013-06-27 DIAGNOSIS — O479 False labor, unspecified: Secondary | ICD-10-CM | POA: Insufficient documentation

## 2013-06-27 DIAGNOSIS — O9989 Other specified diseases and conditions complicating pregnancy, childbirth and the puerperium: Principal | ICD-10-CM

## 2013-06-27 LAB — AMNISURE RUPTURE OF MEMBRANE (ROM) NOT AT ARMC: AMNISURE: NEGATIVE

## 2013-06-27 NOTE — MAU Provider Note (Signed)
  History   Pt presents with history of ? SROM. Good FM . No bleeding. No HA or SOB.  CSN: [id number]  Arrival date and time: 06/27/13 1133   None     Chief Complaint  Patient presents with  . Possible Rupture of Membranes    HPI  OB History   Grav Para Term Preterm Abortions TAB SAB Ect Mult Living   8 2 2  5  5   2       Past Medical History  Diagnosis Date  . GERD (gastroesophageal reflux disease)   . Headache(784.0)   . Acute pulmonary embolus 2005  . MTHFR mutation (methylenetetrahydrofolate reductase)   . Anxiety   . Depression   . Condyloma acuminatum   . External hemorrhoids without mention of complication   . AMA (advanced maternal age) multigravida 35+     No past surgical history on file.  Family History  Problem Relation Age of Onset  . Other Mother     brain tumor unsure if malignant  . Alcohol abuse Father   . Hypertension Father   . Urolithiasis Brother   . COPD Maternal Grandmother   . Stroke Paternal Grandfather   . Heart disease Paternal Grandfather   . Hypertension Paternal Grandfather   . Heart attack Paternal Grandfather     History  Substance Use Topics  . Smoking status: Never Smoker   . Smokeless tobacco: Never Used  . Alcohol Use: No    Allergies: No Known Allergies  Prescriptions prior to admission  Medication Sig Dispense Refill  . busPIRone (BUSPAR) 5 MG tablet Take 5 mg by mouth as needed (anxiety).      . butalbital-acetaminophen-caffeine (FIORICET, ESGIC) 50-325-40 MG per tablet Take 1 tablet by mouth 2 (two) times daily as needed for headache.      . citalopram (CELEXA) 40 MG tablet Take 40 mg by mouth daily.      . heparin 1191410000 UNIT/ML injection Inject 10,000 Units into the skin every 12 (twelve) hours.      . Omeprazole (PRILOSEC PO) Take 1 tablet by mouth daily.      . Prenatal Vit-Fe Fumarate-FA (PRENATAL MULTIVITAMIN) TABS tablet Take 1 tablet by mouth daily at 12 noon.        ROS Physical Exam   Blood  pressure 132/91, pulse 85, temperature 98.1 F (36.7 C), temperature source Oral, resp. rate 17, last menstrual period 10/01/2012. Patient Vitals for the past 24 hrs:  BP Temp Temp src Pulse Resp  06/27/13 1236 132/91 mmHg - - 85 -  06/27/13 1228 140/96 mmHg - - 86 -  06/27/13 1213 130/86 mmHg - - 98 -  06/27/13 1209 148/84 mmHg 98.1 F (36.7 C) Oral 92 17     Physical Exam HEENT ; nl Neck : supple with FROM Lungs: CTA CV: RRR ABD: gravid , NT No CVAT VE: Fern neg, Nitrazine neg, Amnisure neg Ext: neg c/c/e DTRs 2+, no clonus Neuro: non focal Skin: intact  MAU Course  Procedures Reactive NST- rare contractions MDM na  Assessment and Plan  38 weeks. No evidence of SROM Elevated BP - no s/s PEC PEC warnings Fu as scheduled  Lenoard AdenRichard J Fredrika Canby 06/27/2013, 1:27 PM

## 2013-06-27 NOTE — MAU Note (Addendum)
38 yo, G8P2 at 1063w3d, presents to MAU for questionable ROM clear fluid at 0800 today. Reports +FM, rare contractions, no VB. PMH complicated by PE in 2005; daily Lovenox prophylaxis.

## 2013-07-01 ENCOUNTER — Inpatient Hospital Stay (HOSPITAL_COMMUNITY): Payer: 59 | Admitting: Anesthesiology

## 2013-07-01 ENCOUNTER — Inpatient Hospital Stay (HOSPITAL_COMMUNITY)
Admission: RE | Admit: 2013-07-01 | Discharge: 2013-07-03 | DRG: 774 | Disposition: A | Discharge status: Home / Self Care | Payer: 59 | Source: Ambulatory Visit | Attending: Obstetrics and Gynecology | Admitting: Obstetrics and Gynecology

## 2013-07-01 ENCOUNTER — Encounter (HOSPITAL_COMMUNITY): Payer: Self-pay

## 2013-07-01 ENCOUNTER — Encounter (HOSPITAL_COMMUNITY): Payer: 59 | Admitting: Anesthesiology

## 2013-07-01 DIAGNOSIS — Z823 Family history of stroke: Secondary | ICD-10-CM

## 2013-07-01 DIAGNOSIS — O99344 Other mental disorders complicating childbirth: Secondary | ICD-10-CM | POA: Diagnosis present

## 2013-07-01 DIAGNOSIS — Z8249 Family history of ischemic heart disease and other diseases of the circulatory system: Secondary | ICD-10-CM

## 2013-07-01 DIAGNOSIS — Z86711 Personal history of pulmonary embolism: Secondary | ICD-10-CM

## 2013-07-01 DIAGNOSIS — F3289 Other specified depressive episodes: Secondary | ICD-10-CM | POA: Diagnosis present

## 2013-07-01 DIAGNOSIS — A630 Anogenital (venereal) warts: Secondary | ICD-10-CM | POA: Diagnosis present

## 2013-07-01 DIAGNOSIS — Z22330 Carrier of Group B streptococcus: Secondary | ICD-10-CM

## 2013-07-01 DIAGNOSIS — O99892 Other specified diseases and conditions complicating childbirth: Principal | ICD-10-CM | POA: Diagnosis present

## 2013-07-01 DIAGNOSIS — O98519 Other viral diseases complicating pregnancy, unspecified trimester: Secondary | ICD-10-CM | POA: Diagnosis present

## 2013-07-01 DIAGNOSIS — F329 Major depressive disorder, single episode, unspecified: Secondary | ICD-10-CM | POA: Diagnosis present

## 2013-07-01 DIAGNOSIS — O878 Other venous complications in the puerperium: Secondary | ICD-10-CM | POA: Diagnosis present

## 2013-07-01 DIAGNOSIS — K649 Unspecified hemorrhoids: Secondary | ICD-10-CM | POA: Diagnosis present

## 2013-07-01 DIAGNOSIS — O09529 Supervision of elderly multigravida, unspecified trimester: Secondary | ICD-10-CM | POA: Diagnosis present

## 2013-07-01 DIAGNOSIS — O9989 Other specified diseases and conditions complicating pregnancy, childbirth and the puerperium: Principal | ICD-10-CM

## 2013-07-01 LAB — CBC
HEMATOCRIT: 36.8 % (ref 36.0–46.0)
Hemoglobin: 12.7 g/dL (ref 12.0–15.0)
MCH: 31.8 pg (ref 26.0–34.0)
MCHC: 34.5 g/dL (ref 30.0–36.0)
MCV: 92 fL (ref 78.0–100.0)
PLATELETS: 172 10*3/uL (ref 150–400)
RBC: 4 MIL/uL (ref 3.87–5.11)
RDW: 13.7 % (ref 11.5–15.5)
WBC: 7.7 10*3/uL (ref 4.0–10.5)

## 2013-07-01 LAB — RPR

## 2013-07-01 MED ORDER — BUPIVACAINE HCL (PF) 0.25 % IJ SOLN
INTRAMUSCULAR | Status: DC | PRN
  Administered 2013-07-01: 8 mL
  Administered 2013-07-01: 6 mL via EPIDURAL

## 2013-07-01 MED ORDER — ACETAMINOPHEN 325 MG PO TABS
650.0000 mg | ORAL_TABLET | ORAL | Status: DC | PRN
  Administered 2013-07-01: 650 mg via ORAL
  Filled 2013-07-01: qty 2

## 2013-07-01 MED ORDER — PROMETHAZINE HCL 25 MG/ML IJ SOLN
12.5000 mg | INTRAMUSCULAR | Status: DC | PRN
  Administered 2013-07-01: 21:00:00 via INTRAVENOUS
  Filled 2013-07-01: qty 1

## 2013-07-01 MED ORDER — CITRIC ACID-SODIUM CITRATE 334-500 MG/5ML PO SOLN
30.0000 mL | ORAL | Status: DC | PRN
Start: 2013-07-01 — End: 2013-07-02

## 2013-07-01 MED ORDER — DIPHENHYDRAMINE HCL 50 MG/ML IJ SOLN: 12.5000 mg | INTRAMUSCULAR | Status: DC | PRN

## 2013-07-01 MED ORDER — PENICILLIN G POTASSIUM 5000000 UNITS IJ SOLR
2.5000 10*6.[IU] | INTRAVENOUS | Status: DC
  Administered 2013-07-01 (×4): 2.5 10*6.[IU] via INTRAVENOUS
  Filled 2013-07-01 (×8): qty 2.5

## 2013-07-01 MED ORDER — OXYTOCIN BOLUS FROM INFUSION
500.0000 mL | INTRAVENOUS | Status: DC
  Administered 2013-07-01: 500 mL via INTRAVENOUS

## 2013-07-01 MED ORDER — PENICILLIN G POTASSIUM 5000000 UNITS IJ SOLR
5.0000 10*6.[IU] | Freq: Once | INTRAVENOUS | Status: AC
  Administered 2013-07-01: 5 10*6.[IU] via INTRAVENOUS
  Filled 2013-07-01: qty 5

## 2013-07-01 MED ORDER — SODIUM BICARBONATE 8.4 % IV SOLN
INTRAVENOUS | Status: DC | PRN
  Administered 2013-07-01: 10 mL via EPIDURAL
  Administered 2013-07-01: 5 mL via EPIDURAL
  Administered 2013-07-01: 8 mL via EPIDURAL

## 2013-07-01 MED ORDER — EPHEDRINE 5 MG/ML INJ
INTRAVENOUS | Status: AC
  Filled 2013-07-01: qty 4

## 2013-07-01 MED ORDER — LACTATED RINGERS IV SOLN: 500.0000 mL | INTRAVENOUS | Status: DC | PRN

## 2013-07-01 MED ORDER — FENTANYL 2.5 MCG/ML BUPIVACAINE 1/10 % EPIDURAL INFUSION (WH - ANES)
14.0000 mL/h | INTRAMUSCULAR | Status: DC | PRN
  Administered 2013-07-01: 14 mL/h via EPIDURAL
  Filled 2013-07-01: qty 125

## 2013-07-01 MED ORDER — LACTATED RINGERS IV SOLN
INTRAVENOUS | Status: DC
  Administered 2013-07-01 (×2): via INTRAVENOUS
  Administered 2013-07-01: 1000 mL via INTRAVENOUS

## 2013-07-01 MED ORDER — EPHEDRINE 5 MG/ML INJ
10.0000 mg | INTRAVENOUS | Status: DC | PRN
  Filled 2013-07-01: qty 2

## 2013-07-01 MED ORDER — PHENYLEPHRINE 40 MCG/ML (10ML) SYRINGE FOR IV PUSH (FOR BLOOD PRESSURE SUPPORT)
80.0000 ug | PREFILLED_SYRINGE | INTRAVENOUS | Status: DC | PRN
  Filled 2013-07-01: qty 2

## 2013-07-01 MED ORDER — OXYTOCIN 40 UNITS IN LACTATED RINGERS INFUSION - SIMPLE MED
1.0000 m[IU]/min | INTRAVENOUS | Status: DC
Start: 2013-07-01 — End: 2013-07-02
  Administered 2013-07-01: 10 m[IU]/min via INTRAVENOUS
  Administered 2013-07-01: 12 m[IU]/min via INTRAVENOUS
  Administered 2013-07-01: 2 m[IU]/min via INTRAVENOUS
  Administered 2013-07-01: 12 m[IU]/min via INTRAVENOUS
  Filled 2013-07-01: qty 1000

## 2013-07-01 MED ORDER — PHENYLEPHRINE 40 MCG/ML (10ML) SYRINGE FOR IV PUSH (FOR BLOOD PRESSURE SUPPORT)
80.0000 ug | PREFILLED_SYRINGE | INTRAVENOUS | Status: DC | PRN
  Filled 2013-07-01: qty 2
  Filled 2013-07-01: qty 10

## 2013-07-01 MED ORDER — FLEET ENEMA 7-19 GM/118ML RE ENEM: 1.0000 | ENEMA | RECTAL | Status: DC | PRN

## 2013-07-01 MED ORDER — PHENYLEPHRINE 40 MCG/ML (10ML) SYRINGE FOR IV PUSH (FOR BLOOD PRESSURE SUPPORT)
PREFILLED_SYRINGE | INTRAVENOUS | Status: AC
  Filled 2013-07-01: qty 10

## 2013-07-01 MED ORDER — OXYTOCIN 40 UNITS IN LACTATED RINGERS INFUSION - SIMPLE MED: 62.5000 mL/h | INTRAVENOUS | Status: DC

## 2013-07-01 MED ORDER — OXYCODONE-ACETAMINOPHEN 5-325 MG PO TABS: 1.0000 | ORAL_TABLET | ORAL | Status: DC | PRN

## 2013-07-01 MED ORDER — FENTANYL 2.5 MCG/ML BUPIVACAINE 1/10 % EPIDURAL INFUSION (WH - ANES)
INTRAMUSCULAR | Status: AC
  Administered 2013-07-01: 14 mL/h via EPIDURAL
  Filled 2013-07-01: qty 125

## 2013-07-01 MED ORDER — IBUPROFEN 600 MG PO TABS
600.0000 mg | ORAL_TABLET | Freq: Four times a day (QID) | ORAL | Status: DC | PRN
  Administered 2013-07-02: 600 mg via ORAL
  Filled 2013-07-01: qty 1

## 2013-07-01 MED ORDER — LIDOCAINE HCL (PF) 1 % IJ SOLN
30.0000 mL | INTRAMUSCULAR | Status: DC | PRN
  Filled 2013-07-01: qty 30

## 2013-07-01 MED ORDER — LACTATED RINGERS IV SOLN
500.0000 mL | Freq: Once | INTRAVENOUS | Status: AC
  Administered 2013-07-01: 700 mL via INTRAVENOUS

## 2013-07-01 MED ORDER — EPHEDRINE 5 MG/ML INJ
10.0000 mg | INTRAVENOUS | Status: DC | PRN
  Filled 2013-07-01: qty 2
  Filled 2013-07-01: qty 4

## 2013-07-01 MED ORDER — ONDANSETRON HCL 4 MG/2ML IJ SOLN
4.0000 mg | Freq: Four times a day (QID) | INTRAMUSCULAR | Status: DC | PRN
  Administered 2013-07-01 (×2): 4 mg via INTRAVENOUS
  Filled 2013-07-01 (×3): qty 2

## 2013-07-01 NOTE — Progress Notes (Signed)
Lanell PersonsJennifer S Haros is a 38 y.o. [id number] at 3576w0d by LMP admitted for induction of labor due to history of PE.  Subjective: Nausea  Objective: BP 134/77  Pulse 70  Temp(Src) 98.4 F (36.9 C) (Oral)  Resp 18  Ht 5\' 10"  (1.778 m)  Wt 89.812 kg (198 lb)  BMI 28.41 kg/m2  SpO2 100%  LMP 10/01/2012      FHT:  FHR: 155 bpm, variability: moderate,  accelerations:  Present,  decelerations:  Absent UC:   regular, every 3 minutes SVE:   Dilation: 7 Effacement (%): 100 Station: 0 Exam by:: Veronica Mensah 170-200 MVU  Labs: Lab Results  Component Value Date   WBC 7.7 07/01/2013   HGB 12.7 07/01/2013   HCT 36.8 07/01/2013   MCV 92.0 07/01/2013   PLT 172 07/01/2013    Assessment / Plan: Induction of labor due to [facility] conditions,  progressing well on pitocin  Labor: Progressing normally Preeclampsia:  no signs or symptoms of toxicity Fetal Wellbeing:  Category I Pain Control:  Epidural I/D:  n/a Anticipated MOD:  NSVD  Lenoard AdenRichard J Ardelle Haliburton 07/01/2013, 9:03 PM

## 2013-07-01 NOTE — Anesthesia Preprocedure Evaluation (Signed)

## 2013-07-01 NOTE — Progress Notes (Signed)
Video not running, RN discussed content with pt

## 2013-07-01 NOTE — H&P (Signed)
Erin Hays is a 38 y.o. female presenting for induction with history of PE on UFH . Maternal Medical History:  Fetal activity: Perceived fetal activity is normal.   Last perceived fetal movement was within the past hour.    Prenatal complications: no prenatal complications Prenatal Complications - Diabetes: none.    OB History   Grav Para Term Preterm Abortions TAB SAB Ect Mult Living   8 2 2  5  5   2      Past Medical History  Diagnosis Date  . GERD (gastroesophageal reflux disease)   . Headache(784.0)   . Acute pulmonary embolus 2005  . MTHFR mutation (methylenetetrahydrofolate reductase)   . Anxiety   . Depression   . Condyloma acuminatum   . External hemorrhoids without mention of complication   . AMA (advanced maternal age) multigravida 35+    History reviewed. No pertinent past surgical history. Family History: family history includes Alcohol abuse in her father; COPD in her maternal grandmother; Heart attack in her paternal grandfather; Heart disease in her paternal grandfather; Hypertension in her father and paternal grandfather; Other in her mother; Stroke in her paternal grandfather; Urolithiasis in her brother. Social History:  reports that she has never smoked. She has never used smokeless tobacco. She reports that she does not drink alcohol or use illicit drugs.   Prenatal Transfer Tool  Maternal Diabetes: No Genetic Screening: Normal Maternal Ultrasounds/Referrals: Normal Fetal Ultrasounds or other Referrals:  None Maternal Substance Abuse:  No Significant Maternal Medications:  None Significant Maternal Lab Results:  Lab values include: Group B Strep positive Other Comments:  None  Review of Systems  All other systems reviewed and are negative.   Dilation: 3 Effacement (%): Thick Station: -2 Exam by:: Erin Hays Blood pressure 121/80, pulse 83, temperature 98.3 F (36.8 C), temperature source Oral, resp. rate 18, height 5\' 10"  (1.778 m), weight  89.812 kg (198 lb), last menstrual period 10/01/2012, SpO2 99.00%. Maternal Exam:  Uterine Assessment: Contraction strength is mild.  Contraction frequency is rare.   Abdomen: Patient reports no abdominal tenderness. Fetal presentation: vertex  Introitus: Normal vulva. Normal vagina.  Ferning test: not done.  Nitrazine test: not done. Amniotic fluid character: not assessed.  Pelvis: adequate for delivery.      Physical Exam  Nursing note and vitals reviewed. Constitutional: She is oriented to person, place, and time. She appears well-developed and well-nourished.  HENT:  Head: Normocephalic and atraumatic.  Eyes: Pupils are equal, round, and reactive to light.  Cardiovascular: Normal rate and regular rhythm.   GI: Soft. Bowel sounds are normal.  Genitourinary: Uterus normal.  Musculoskeletal: Normal range of motion.  Neurological: She is alert and oriented to person, place, and time.  Skin: Skin is warm and dry.  Psychiatric: She has a normal mood and affect.    Prenatal labs: ABO, Rh: O/Positive/-- (10/07 0000) Antibody: Negative (10/07 0000) Rubella: Nonimmune (10/07 0000) RPR: Nonreactive (10/07 0000)  HBsAg: Negative (10/07 0000)  HIV: Non-reactive (10/07 0000)  GBS: Positive (03/10 0000)   Assessment/Plan: 39 weeks History of PE on UFH until 24 hrs ago Induction   Erin Hays 07/01/2013, 1:09 PM

## 2013-07-01 NOTE — Anesthesia Procedure Notes (Signed)

## 2013-07-01 NOTE — Progress Notes (Signed)
Erin Hays is a 38 y.o. [id number] at 2479w0d by LMP admitted for induction of labor due to history of PE.  Subjective: Right sided discomfort  Objective: BP 121/80  Pulse 83  Temp(Src) 98.3 F (36.8 C) (Oral)  Resp 18  Ht 5\' 10"  (1.778 m)  Wt 89.812 kg (198 lb)  BMI 28.41 kg/m2  SpO2 99%  LMP 10/01/2012      FHT:  FHR: 145 bpm, variability: moderate,  accelerations:  Present,  decelerations:  Absent UC:   regular, every 3 minutes SVE:   Dilation: 3 Effacement (%): Thick Station: -2 Exam by:: Erin Hays Attempted AROM  Labs: Lab Results  Component Value Date   WBC 7.7 07/01/2013   HGB 12.7 07/01/2013   HCT 36.8 07/01/2013   MCV 92.0 07/01/2013   PLT 172 07/01/2013    Assessment / Plan: Induction of labor due to history of PE,  progressing well on pitocin  Labor: Progressing normally Preeclampsia:  no signs or symptoms of toxicity Fetal Wellbeing:  Category I Pain Control:  Epidural I/D:  n/a Anticipated MOD:  NSVD  Erin Hays 07/01/2013, 1:11 PM

## 2013-07-02 ENCOUNTER — Encounter (HOSPITAL_COMMUNITY): Payer: Self-pay

## 2013-07-02 LAB — CBC
HCT: 32.4 % — ABNORMAL LOW (ref 36.0–46.0)
Hemoglobin: 10.8 g/dL — ABNORMAL LOW (ref 12.0–15.0)
MCH: 30.7 pg (ref 26.0–34.0)
MCHC: 33.3 g/dL (ref 30.0–36.0)
MCV: 92 fL (ref 78.0–100.0)
Platelets: 160 10*3/uL (ref 150–400)
RBC: 3.52 MIL/uL — ABNORMAL LOW (ref 3.87–5.11)
RDW: 14 % (ref 11.5–15.5)
WBC: 13.8 10*3/uL — ABNORMAL HIGH (ref 4.0–10.5)

## 2013-07-02 MED ORDER — MEASLES, MUMPS & RUBELLA VAC ~~LOC~~ INJ
0.5000 mL | INJECTION | Freq: Once | SUBCUTANEOUS | Status: AC
  Administered 2013-07-03: 0.5 mL via SUBCUTANEOUS
  Filled 2013-07-02 (×2): qty 0.5

## 2013-07-02 MED ORDER — BENZOCAINE-MENTHOL 20-0.5 % EX AERO
1.0000 "application " | INHALATION_SPRAY | CUTANEOUS | Status: DC | PRN
  Administered 2013-07-02: 1 via TOPICAL
  Filled 2013-07-02: qty 56

## 2013-07-02 MED ORDER — METHYLERGONOVINE MALEATE 0.2 MG/ML IJ SOLN: 0.2000 mg | INTRAMUSCULAR | Status: DC | PRN

## 2013-07-02 MED ORDER — LANOLIN HYDROUS EX OINT: TOPICAL_OINTMENT | CUTANEOUS | Status: DC | PRN

## 2013-07-02 MED ORDER — DIPHENHYDRAMINE HCL 25 MG PO CAPS: 25.0000 mg | ORAL_CAPSULE | Freq: Four times a day (QID) | ORAL | Status: DC | PRN

## 2013-07-02 MED ORDER — SENNOSIDES-DOCUSATE SODIUM 8.6-50 MG PO TABS
2.0000 | ORAL_TABLET | ORAL | Status: DC
  Administered 2013-07-02 (×2): 2 via ORAL
  Filled 2013-07-02 (×2): qty 2

## 2013-07-02 MED ORDER — SIMETHICONE 80 MG PO CHEW: 80.0000 mg | CHEWABLE_TABLET | ORAL | Status: DC | PRN

## 2013-07-02 MED ORDER — ONDANSETRON HCL 4 MG PO TABS: 4.0000 mg | ORAL_TABLET | ORAL | Status: DC | PRN

## 2013-07-02 MED ORDER — DIBUCAINE 1 % RE OINT
1.0000 "application " | TOPICAL_OINTMENT | RECTAL | Status: DC | PRN
  Administered 2013-07-02: 1 via RECTAL
  Filled 2013-07-02: qty 28

## 2013-07-02 MED ORDER — IBUPROFEN 600 MG PO TABS
600.0000 mg | ORAL_TABLET | Freq: Four times a day (QID) | ORAL | Status: DC
  Administered 2013-07-02 – 2013-07-03 (×5): 600 mg via ORAL
  Filled 2013-07-02 (×5): qty 1

## 2013-07-02 MED ORDER — OXYCODONE-ACETAMINOPHEN 5-325 MG PO TABS
1.0000 | ORAL_TABLET | ORAL | Status: DC | PRN
  Administered 2013-07-02 – 2013-07-03 (×3): 1 via ORAL
  Filled 2013-07-02 (×3): qty 1

## 2013-07-02 MED ORDER — WITCH HAZEL-GLYCERIN EX PADS: 1.0000 "application " | MEDICATED_PAD | CUTANEOUS | Status: DC | PRN

## 2013-07-02 MED ORDER — ONDANSETRON HCL 4 MG/2ML IJ SOLN: 4.0000 mg | INTRAMUSCULAR | Status: DC | PRN

## 2013-07-02 MED ORDER — CITALOPRAM HYDROBROMIDE 40 MG PO TABS
40.0000 mg | ORAL_TABLET | Freq: Every day | ORAL | Status: DC
  Administered 2013-07-02 – 2013-07-03 (×2): 40 mg via ORAL
  Filled 2013-07-02 (×2): qty 1

## 2013-07-02 MED ORDER — METHYLERGONOVINE MALEATE 0.2 MG PO TABS: 0.2000 mg | ORAL_TABLET | ORAL | Status: DC | PRN

## 2013-07-02 MED ORDER — TETANUS-DIPHTH-ACELL PERTUSSIS 5-2.5-18.5 LF-MCG/0.5 IM SUSP
0.5000 mL | Freq: Once | INTRAMUSCULAR | Status: AC
  Administered 2013-07-03: 0.5 mL via INTRAMUSCULAR
  Filled 2013-07-02: qty 0.5

## 2013-07-02 MED ORDER — ZOLPIDEM TARTRATE 5 MG PO TABS: 5.0000 mg | ORAL_TABLET | Freq: Every evening | ORAL | Status: DC | PRN

## 2013-07-02 MED ORDER — PRENATAL MULTIVITAMIN CH
1.0000 | ORAL_TABLET | Freq: Every day | ORAL | Status: DC
  Administered 2013-07-02: 1 via ORAL
  Filled 2013-07-02: qty 1

## 2013-07-02 MED ORDER — ENOXAPARIN SODIUM 40 MG/0.4ML ~~LOC~~ SOLN
40.0000 mg | SUBCUTANEOUS | Status: DC
  Administered 2013-07-02: 40 mg via SUBCUTANEOUS
  Filled 2013-07-02: qty 0.4

## 2013-07-02 NOTE — Anesthesia Postprocedure Evaluation (Signed)
Anesthesia Post Note  Patient: Erin PersonsJennifer S Vickrey  Procedure(s) Performed: * No procedures listed *  Anesthesia type: Epidural  Patient location: Mother/Baby  Post pain: Pain level controlled  Post assessment: Post-op Vital signs reviewed  Last Vitals:  Filed Vitals:   07/02/13 0649  BP: 107/59  Pulse: 73  Temp: 37.1 C  Resp: 20    Post vital signs: Reviewed  Level of consciousness:alert  Complications: No apparent anesthesia complications

## 2013-07-02 NOTE — Lactation Note (Addendum)
This note was copied from the chart of Boy Charyl DancerJennifer Kitner. Lactation Consultation Note Initial consult:  Ex BF, P3.  Mother denies any problems or questions. Mother states she knows how to hand express and has expressed colostrum. Mom encouraged to feed baby 8-12 times/24 hours and with feeding cues.  Mom made aware of O/P services, breastfeeding support groups, community resources, and our phone # for post-discharge questions.   Patient Name: Boy Charyl DancerJennifer Go GNFAO'ZToday's Date: 07/02/2013 Reason for consult: Initial assessment   Maternal Data Infant to breast within first hour of birth: Yes Has patient been taught Hand Expression?: Yes Does the patient have breastfeeding experience prior to this delivery?: Yes  Feeding Feeding Type: Breast Fed Length of feed: 15 min  LATCH Score/Interventions Latch: Grasps breast easily, tongue down, lips flanged, rhythmical sucking.  Audible Swallowing: A few with stimulation Intervention(s): Skin to skin  Type of Nipple: Everted at rest and after stimulation  Comfort (Breast/Nipple): Soft / non-tender     Hold (Positioning): Assistance needed to correctly position infant at breast and maintain latch. Intervention(s): Support Pillows  LATCH Score: 8  Lactation Tools Discussed/Used     Consult Status Consult Status: Follow-up Date: 07/03/13 Follow-up type: In-patient    Dulce SellarRuth Boschen Berkelhammer 07/02/2013, 11:49 AM

## 2013-07-02 NOTE — Progress Notes (Signed)
Patient ID: Erin Hays, female   DOB: 12/08/1975, 38 y.o.   MRN: [id number]  PPD # 1  Subjective: Pt reports feeling well / Pain controlled with ibuprofen Tolerating po/ Voiding without problems/ No n/v Bleeding is moderate Newborn info:  Information for the patient's newborn:  Erin Hays, Boy Aida [[id number]][[id number]]  female  / circ to be performed later today by Dr Billy Coastaavon / Feeding: breast   Objective:  VS: Blood pressure 107/59, pulse 73, temperature 98.7 F (37.1 C), temperature source Oral, resp. rate 20.    Recent Labs  07/01/13 0750 07/02/13 0605  WBC 7.7 13.8*  HGB 12.7 10.8*  HCT 36.8 32.4*  PLT 172 160    Blood type: O/Positive/-- (10/07 0000) Rubella: Nonimmune (10/07 0000)    Physical Exam:  General: alert, cooperative and no distress CV: Regular rate and rhythm Resp: clear Abdomen: soft, nontender, normal bowel sounds Uterine Fundus: firm, below umbilicus, nontender Perineum: healing with good reapproximation Lochia: minimal Ext: Homans sign is negative, no sign of DVT and no edema, redness or tenderness in the calves or thighs   A/P: PPD # 1/ G8P3053/ S/P: SVD w/ 2nd deg lac with repair Doing well Continue routine post partum orders Anticipate D/C home in AM    Demetrius RevelJulie K Aviela Blundell, MSN, [facility] 07/02/2013, 12:07 PM

## 2013-07-02 NOTE — Progress Notes (Signed)
Patient was referred for history of depression/anxiety.  * Referral screened out by Clinical Social Worker because none of the following criteria appear to apply:  ~ History of anxiety/depression during this pregnancy, or of post-partum depression.  ~ Diagnosis of anxiety and/or depression within last 3 years  ~ History of depression due to pregnancy loss/loss of child  OR  * Patient's symptoms currently being treated with medication (Celexa & Buspar) and/or therapy.  Please contact the Clinical Social Worker if needs arise, or by the patient's request.       

## 2013-07-03 MED ORDER — DOCUSATE SODIUM 100 MG PO CAPS: 100.0000 mg | ORAL_CAPSULE | Freq: Two times a day (BID) | ORAL | Status: DC | PRN

## 2013-07-03 MED ORDER — OXYCODONE-ACETAMINOPHEN 5-325 MG PO TABS: 1.0000 | ORAL_TABLET | ORAL | Status: DC | PRN

## 2013-07-03 MED ORDER — IBUPROFEN 800 MG PO TABS: ORAL_TABLET | ORAL | Status: DC

## 2013-07-03 NOTE — Discharge Summary (Signed)
  Obstetric Discharge Summary  Reason for Admission: Pt is a G8 P2052 at 3919w0d for IOL d/t hx PE; GBS Pos   [facility] 7 wks, with Dr Billy Coastaavon as primary provider.  Medications on Admission: No prescriptions prior to admission    Prenatal Labs: ABO, Rh: O/Positive/-- (10/07 0000)  Antibody: Negative (10/07 0000) Rubella: Nonimmune (10/07 0000)  RPR: NON REAC (05/06 0750)  HBsAg: Negative (10/07 0000)  HIV: Non-reactive (10/07 0000)  GTT : 104 GBS: Positive (03/10 0000)   Prenatal Procedures: U/S, NST Intrapartum Procedures: 2nd deg lac Postpartum Procedures: None Complications-Operative and Postpartum: none  Labs: HGB  Date Value Ref Range Status  04/24/2010 12.4  11.6 - 15.9 g/dL Final     Hemoglobin  Date Value Ref Range Status  07/02/2013 10.8* 12.0 - 15.0 g/dL Final     HCT  Date Value Ref Range Status  07/02/2013 32.4* 36.0 - 46.0 % Final  04/24/2010 35.8  34.8 - 46.6 % Final   Lab Results  Component Value Date   PLT 160 07/02/2013    Newborn Data: Live born female  Birth Weight: 6 lb 15.8 oz (3170 g) APGAR: 8, 9  Home with mother   Discharge Information: Date: 07/09/2013 Discharge Diagnoses:  Pt is a [id number] S/P SVD with 2nd deg repair on 07/01/13 Condition: stab le Activity: pelvic rest Diet: routine Medications:    Medication List    STOP taking these medications       heparin [id number] UNIT/ML injection      TAKE these medications       busPIRone 5 MG tablet  Commonly known as:  BUSPAR  Take 5 mg by mouth as needed (anxiety).     butalbital-acetaminophen-caffeine 50-325-40 MG per tablet  Commonly known as:  FIORICET, ESGIC  Take 1 tablet by mouth 2 (two) times daily as needed for headache.     citalopram 40 MG tablet  Commonly known as:  CELEXA  Take 40 mg by mouth daily.     docusate sodium 100 MG capsule  Commonly known as:  COLACE  Take 1 capsule (100 mg total) by mouth 2 (two) times daily as  needed.     ibuprofen 800 MG tablet  Commonly known as:  ADVIL,MOTRIN  1 tab Q 8 hrs prn pain     oxyCODONE-acetaminophen 5-325 MG per tablet  Commonly known as:  PERCOCET/ROXICET  Take 1-2 tablets by mouth every 4 (four) hours as needed for severe pain (moderate - severe pain).     prenatal multivitamin Tabs tablet  Take 1 tablet by mouth daily at 12 noon.     PRILOSEC PO  Take 1 tablet by mouth daily.       Instructions: [facility] instruction booklet has been given and reviewed Discharge to: home Follow-up Information   Follow up with Lenoard AdenAAVON,RICHARD J, MD.   Specialty:  Obstetrics and Gynecology   Contact information:   [address] [place] [address] [phone]       Demetrius RevelJulie K Elias Bordner, MSN, [facility] 07/09/2013, 7:34 PM

## 2013-07-03 NOTE — Progress Notes (Signed)
Patient ID: Erin PersonsJennifer S Hays, female   DOB: 05/29/1975, 38 y.o.   MRN: [id number] PPD # 2  Subjective: Pt reports feeling well and eager for d/c home / c/O significant cramping.  Pain controlled with ibuprofen and percocet Tolerating po/ Voiding without problems/ No n/v Bleeding is light/ Newborn info:  Information for the patient's newborn:  Erin Hays, Boy Erin Hays [[id number]][[id number]]  female  / circ performed / Feeding: breast    Objective:  VS: Blood pressure 99/58, pulse 89, temperature 97.9 F (36.6 C), temperature source Oral, resp. rate 17    Recent Labs  07/01/13 0750 07/02/13 0605  WBC 7.7 13.8*  HGB 12.7 10.8*  HCT 36.8 32.4*  PLT 172 160    Blood type: O/Positive/-- (10/07 0000) Rubella: Nonimmune (10/07 0000)    Physical Exam:  General:  alert, cooperative and no distress CV: Regular rate and rhythm Resp: clear Abdomen: soft, nontender, normal bowel sounds Uterine Fundus: firm, below umbilicus, nontender Perineum: healing with good reapproximation Lochia: minimal Ext: edema trace and Homans sign is negative, no sign of DVT    A/P: PPD # 2/ G8P3053/ S/P: SVD w/ 2nd deg repair Doing well and stable for discharge home RX: Ibuprofen 800mg  po Q 6 hrs prn pain #30 Refill x 1 Percocet 5/325 1 to 2 po Q 4 hrs prn pain #15 No refill Colace OTC [facility] booklet given Routine pp visit in 6wks   Erin RevelJulie K Matilynn Dacey, MSN, [facility] 07/03/2013, 9:45 AM

## 2013-12-28 ENCOUNTER — Encounter (HOSPITAL_COMMUNITY): Payer: Self-pay

## 2015-02-20 ENCOUNTER — Encounter (HOSPITAL_COMMUNITY): Payer: Self-pay | Admitting: Emergency Medicine

## 2015-02-20 DIAGNOSIS — R1110 Vomiting, unspecified: Secondary | ICD-10-CM | POA: Diagnosis present

## 2015-02-20 DIAGNOSIS — R197 Diarrhea, unspecified: Secondary | ICD-10-CM | POA: Insufficient documentation

## 2015-02-20 LAB — COMPREHENSIVE METABOLIC PANEL
ALBUMIN: 4.4 g/dL (ref 3.5–5.0)
ALT: 19 U/L (ref 14–54)
AST: 27 U/L (ref 15–41)
Alkaline Phosphatase: 66 U/L (ref 38–126)
Anion gap: 14 (ref 5–15)
BUN: 24 mg/dL — AB (ref 6–20)
CHLORIDE: 103 mmol/L (ref 101–111)
CO2: 23 mmol/L (ref 22–32)
CREATININE: 1.06 mg/dL — AB (ref 0.44–1.00)
Calcium: 9.6 mg/dL (ref 8.9–10.3)
GFR calc non Af Amer: >60 mL/min (ref >60)
GLUCOSE: 196 mg/dL — AB (ref 65–99)
Potassium: 3.6 mmol/L (ref 3.5–5.1)
SODIUM: 140 mmol/L (ref 135–145)
Total Bilirubin: 1 mg/dL (ref 0.3–1.2)
Total Protein: 7.7 g/dL (ref 6.5–8.1)

## 2015-02-20 LAB — CBC
HCT: 44 % (ref 36.0–46.0)
Hemoglobin: 14.9 g/dL (ref 12.0–15.0)
MCH: 31.9 pg (ref 26.0–34.0)
MCHC: 33.9 g/dL (ref 30.0–36.0)
MCV: 94.2 fL (ref 78.0–100.0)
PLATELETS: 228 10*3/uL (ref 150–400)
RBC: 4.67 MIL/uL (ref 3.87–5.11)
RDW: 12.6 % (ref 11.5–15.5)
WBC: 12.6 10*3/uL — ABNORMAL HIGH (ref 4.0–10.5)

## 2015-02-20 LAB — LIPASE, BLOOD: LIPASE: 24 U/L (ref 11–51)

## 2015-02-20 MED ORDER — ONDANSETRON 4 MG PO TBDP
4.0000 mg | ORAL_TABLET | Freq: Once | ORAL | Status: AC | PRN
  Administered 2015-02-20: 4 mg via ORAL

## 2015-02-20 MED ORDER — ONDANSETRON 4 MG PO TBDP
ORAL_TABLET | ORAL | Status: AC
  Filled 2015-02-20: qty 1

## 2015-02-20 NOTE — ED Notes (Signed)
C/o nausea, vomiting, and diarrhea since 4:30pm today.  Denies abd pain.

## 2015-02-20 NOTE — ED Notes (Signed)
Husband reports pt took Zofran and Phenergan suppository at home but was still vomiting.  States son is also vomiting.

## 2015-02-21 ENCOUNTER — Emergency Department (HOSPITAL_COMMUNITY)
Admission: EM | Admit: 2015-02-21 | Discharge: 2015-02-21 | Discharge status: Against Medical Advice | Payer: 59 | Attending: Emergency Medicine | Admitting: Emergency Medicine

## 2015-02-21 NOTE — ED Notes (Signed)
Pt called but no answer moved to OTF

## 2015-02-21 NOTE — ED Notes (Signed)
Pt called for D33 no answer.

## 2016-10-04 ENCOUNTER — Other Ambulatory Visit: Payer: Self-pay | Admitting: Emergency Medicine

## 2019-04-02 DIAGNOSIS — E663 Overweight: Secondary | ICD-10-CM | POA: Diagnosis not present

## 2019-04-02 DIAGNOSIS — F900 Attention-deficit hyperactivity disorder, predominantly inattentive type: Secondary | ICD-10-CM | POA: Diagnosis not present

## 2019-04-02 DIAGNOSIS — I10 Essential (primary) hypertension: Secondary | ICD-10-CM | POA: Diagnosis not present

## 2019-04-02 DIAGNOSIS — F419 Anxiety disorder, unspecified: Secondary | ICD-10-CM | POA: Diagnosis not present

## 2019-04-02 DIAGNOSIS — R7989 Other specified abnormal findings of blood chemistry: Secondary | ICD-10-CM | POA: Diagnosis not present

## 2019-04-02 DIAGNOSIS — R5383 Other fatigue: Secondary | ICD-10-CM | POA: Diagnosis not present

## 2019-04-02 DIAGNOSIS — E559 Vitamin D deficiency, unspecified: Secondary | ICD-10-CM | POA: Diagnosis not present

## 2019-04-21 DIAGNOSIS — Z6827 Body mass index (BMI) 27.0-27.9, adult: Secondary | ICD-10-CM | POA: Diagnosis not present

## 2019-04-21 DIAGNOSIS — Z1151 Encounter for screening for human papillomavirus (HPV): Secondary | ICD-10-CM | POA: Diagnosis not present

## 2019-04-21 DIAGNOSIS — Z01419 Encounter for gynecological examination (general) (routine) without abnormal findings: Secondary | ICD-10-CM | POA: Diagnosis not present

## 2019-05-20 DIAGNOSIS — F900 Attention-deficit hyperactivity disorder, predominantly inattentive type: Secondary | ICD-10-CM | POA: Diagnosis not present

## 2019-05-21 DIAGNOSIS — Z23 Encounter for immunization: Secondary | ICD-10-CM | POA: Diagnosis not present

## 2019-06-17 DIAGNOSIS — Z23 Encounter for immunization: Secondary | ICD-10-CM | POA: Diagnosis not present

## 2019-08-19 DIAGNOSIS — F900 Attention-deficit hyperactivity disorder, predominantly inattentive type: Secondary | ICD-10-CM | POA: Diagnosis not present

## 2019-09-01 DIAGNOSIS — F329 Major depressive disorder, single episode, unspecified: Secondary | ICD-10-CM | POA: Diagnosis not present

## 2019-09-01 DIAGNOSIS — H6981 Other specified disorders of Eustachian tube, right ear: Secondary | ICD-10-CM | POA: Diagnosis not present

## 2019-09-01 DIAGNOSIS — I10 Essential (primary) hypertension: Secondary | ICD-10-CM | POA: Diagnosis not present

## 2019-11-17 DIAGNOSIS — F900 Attention-deficit hyperactivity disorder, predominantly inattentive type: Secondary | ICD-10-CM | POA: Diagnosis not present

## 2019-11-26 DIAGNOSIS — Z0000 Encounter for general adult medical examination without abnormal findings: Secondary | ICD-10-CM | POA: Diagnosis not present

## 2019-12-01 DIAGNOSIS — Z0000 Encounter for general adult medical examination without abnormal findings: Secondary | ICD-10-CM | POA: Diagnosis not present

## 2019-12-01 DIAGNOSIS — Z23 Encounter for immunization: Secondary | ICD-10-CM | POA: Diagnosis not present

## 2020-01-07 DIAGNOSIS — M7989 Other specified soft tissue disorders: Secondary | ICD-10-CM | POA: Diagnosis not present

## 2020-02-03 DIAGNOSIS — M7989 Other specified soft tissue disorders: Secondary | ICD-10-CM | POA: Diagnosis not present

## 2020-02-03 DIAGNOSIS — M67472 Ganglion, left ankle and foot: Secondary | ICD-10-CM | POA: Diagnosis not present

## 2020-02-03 DIAGNOSIS — M19072 Primary osteoarthritis, left ankle and foot: Secondary | ICD-10-CM | POA: Diagnosis not present

## 2020-02-03 DIAGNOSIS — R2242 Localized swelling, mass and lump, left lower limb: Secondary | ICD-10-CM | POA: Diagnosis not present

## 2020-04-26 DIAGNOSIS — Z6824 Body mass index (BMI) 24.0-24.9, adult: Secondary | ICD-10-CM | POA: Diagnosis not present

## 2020-04-26 DIAGNOSIS — Z01419 Encounter for gynecological examination (general) (routine) without abnormal findings: Secondary | ICD-10-CM | POA: Diagnosis not present

## 2020-06-16 DIAGNOSIS — Z1231 Encounter for screening mammogram for malignant neoplasm of breast: Secondary | ICD-10-CM | POA: Diagnosis not present

## 2020-07-11 DIAGNOSIS — F900 Attention-deficit hyperactivity disorder, predominantly inattentive type: Secondary | ICD-10-CM | POA: Diagnosis not present

## 2020-07-13 DIAGNOSIS — Z30430 Encounter for insertion of intrauterine contraceptive device: Secondary | ICD-10-CM | POA: Diagnosis not present

## 2020-08-23 DIAGNOSIS — Z30431 Encounter for routine checking of intrauterine contraceptive device: Secondary | ICD-10-CM | POA: Diagnosis not present

## 2020-10-03 DIAGNOSIS — S335XXA Sprain of ligaments of lumbar spine, initial encounter: Secondary | ICD-10-CM | POA: Diagnosis not present

## 2020-10-03 DIAGNOSIS — M791 Myalgia, unspecified site: Secondary | ICD-10-CM | POA: Diagnosis not present

## 2020-10-03 DIAGNOSIS — M545 Low back pain, unspecified: Secondary | ICD-10-CM | POA: Diagnosis not present

## 2020-10-03 DIAGNOSIS — M5451 Vertebrogenic low back pain: Secondary | ICD-10-CM | POA: Diagnosis not present

## 2020-11-01 DIAGNOSIS — N938 Other specified abnormal uterine and vaginal bleeding: Secondary | ICD-10-CM | POA: Diagnosis not present

## 2020-11-14 DIAGNOSIS — W228XXA Striking against or struck by other objects, initial encounter: Secondary | ICD-10-CM | POA: Diagnosis not present

## 2020-11-14 DIAGNOSIS — S8992XA Unspecified injury of left lower leg, initial encounter: Secondary | ICD-10-CM | POA: Diagnosis not present

## 2020-12-08 DIAGNOSIS — Z1339 Encounter for screening examination for other mental health and behavioral disorders: Secondary | ICD-10-CM | POA: Diagnosis not present

## 2020-12-08 DIAGNOSIS — Z1331 Encounter for screening for depression: Secondary | ICD-10-CM | POA: Diagnosis not present

## 2020-12-08 DIAGNOSIS — Z23 Encounter for immunization: Secondary | ICD-10-CM | POA: Diagnosis not present

## 2020-12-08 DIAGNOSIS — Z0000 Encounter for general adult medical examination without abnormal findings: Secondary | ICD-10-CM | POA: Diagnosis not present

## 2020-12-13 DIAGNOSIS — N938 Other specified abnormal uterine and vaginal bleeding: Secondary | ICD-10-CM | POA: Diagnosis not present

## 2021-01-11 DIAGNOSIS — F900 Attention-deficit hyperactivity disorder, predominantly inattentive type: Secondary | ICD-10-CM | POA: Diagnosis not present

## 2021-05-02 DIAGNOSIS — Z6823 Body mass index (BMI) 23.0-23.9, adult: Secondary | ICD-10-CM | POA: Diagnosis not present

## 2021-05-02 DIAGNOSIS — Z01419 Encounter for gynecological examination (general) (routine) without abnormal findings: Secondary | ICD-10-CM | POA: Diagnosis not present

## 2021-07-11 DIAGNOSIS — F900 Attention-deficit hyperactivity disorder, predominantly inattentive type: Secondary | ICD-10-CM | POA: Diagnosis not present

## 2021-10-13 DIAGNOSIS — F900 Attention-deficit hyperactivity disorder, predominantly inattentive type: Secondary | ICD-10-CM | POA: Diagnosis not present

## 2021-11-14 DIAGNOSIS — Z1231 Encounter for screening mammogram for malignant neoplasm of breast: Secondary | ICD-10-CM | POA: Diagnosis not present

## 2021-11-16 DIAGNOSIS — H5712 Ocular pain, left eye: Secondary | ICD-10-CM | POA: Diagnosis not present

## 2021-11-16 DIAGNOSIS — H16002 Unspecified corneal ulcer, left eye: Secondary | ICD-10-CM | POA: Diagnosis not present

## 2021-11-17 DIAGNOSIS — H16002 Unspecified corneal ulcer, left eye: Secondary | ICD-10-CM | POA: Diagnosis not present

## 2021-11-24 DIAGNOSIS — R922 Inconclusive mammogram: Secondary | ICD-10-CM | POA: Diagnosis not present

## 2021-11-24 DIAGNOSIS — R921 Mammographic calcification found on diagnostic imaging of breast: Secondary | ICD-10-CM | POA: Diagnosis not present

## 2021-11-24 DIAGNOSIS — R928 Other abnormal and inconclusive findings on diagnostic imaging of breast: Secondary | ICD-10-CM | POA: Diagnosis not present

## 2021-12-15 DIAGNOSIS — E559 Vitamin D deficiency, unspecified: Secondary | ICD-10-CM | POA: Diagnosis not present

## 2021-12-15 DIAGNOSIS — F419 Anxiety disorder, unspecified: Secondary | ICD-10-CM | POA: Diagnosis not present

## 2021-12-15 DIAGNOSIS — I10 Essential (primary) hypertension: Secondary | ICD-10-CM | POA: Diagnosis not present

## 2021-12-20 DIAGNOSIS — Z23 Encounter for immunization: Secondary | ICD-10-CM | POA: Diagnosis not present

## 2021-12-20 DIAGNOSIS — Z1331 Encounter for screening for depression: Secondary | ICD-10-CM | POA: Diagnosis not present

## 2021-12-20 DIAGNOSIS — Z0000 Encounter for general adult medical examination without abnormal findings: Secondary | ICD-10-CM | POA: Diagnosis not present

## 2021-12-20 DIAGNOSIS — Z1339 Encounter for screening examination for other mental health and behavioral disorders: Secondary | ICD-10-CM | POA: Diagnosis not present

## 2022-06-28 DIAGNOSIS — F900 Attention-deficit hyperactivity disorder, predominantly inattentive type: Secondary | ICD-10-CM | POA: Diagnosis not present

## 2022-10-19 DIAGNOSIS — L237 Allergic contact dermatitis due to plants, except food: Secondary | ICD-10-CM | POA: Diagnosis not present

## 2022-11-08 DIAGNOSIS — H02889 Meibomian gland dysfunction of unspecified eye, unspecified eyelid: Secondary | ICD-10-CM | POA: Diagnosis not present

## 2022-11-08 DIAGNOSIS — M3501 Sicca syndrome with keratoconjunctivitis: Secondary | ICD-10-CM | POA: Diagnosis not present

## 2022-11-16 DIAGNOSIS — M3501 Sicca syndrome with keratoconjunctivitis: Secondary | ICD-10-CM | POA: Diagnosis not present

## 2022-11-16 DIAGNOSIS — H169 Unspecified keratitis: Secondary | ICD-10-CM | POA: Diagnosis not present

## 2022-11-19 DIAGNOSIS — M3501 Sicca syndrome with keratoconjunctivitis: Secondary | ICD-10-CM | POA: Diagnosis not present

## 2022-11-19 DIAGNOSIS — H169 Unspecified keratitis: Secondary | ICD-10-CM | POA: Diagnosis not present

## 2022-11-20 DIAGNOSIS — N959 Unspecified menopausal and perimenopausal disorder: Secondary | ICD-10-CM | POA: Diagnosis not present

## 2022-11-28 DIAGNOSIS — Z1231 Encounter for screening mammogram for malignant neoplasm of breast: Secondary | ICD-10-CM | POA: Diagnosis not present

## 2022-12-18 DIAGNOSIS — Z6823 Body mass index (BMI) 23.0-23.9, adult: Secondary | ICD-10-CM | POA: Diagnosis not present

## 2022-12-18 DIAGNOSIS — Z01419 Encounter for gynecological examination (general) (routine) without abnormal findings: Secondary | ICD-10-CM | POA: Diagnosis not present

## 2022-12-18 DIAGNOSIS — Z124 Encounter for screening for malignant neoplasm of cervix: Secondary | ICD-10-CM | POA: Diagnosis not present

## 2022-12-18 DIAGNOSIS — R61 Generalized hyperhidrosis: Secondary | ICD-10-CM | POA: Diagnosis not present

## 2022-12-18 DIAGNOSIS — N951 Menopausal and female climacteric states: Secondary | ICD-10-CM | POA: Diagnosis not present

## 2022-12-18 DIAGNOSIS — Z1331 Encounter for screening for depression: Secondary | ICD-10-CM | POA: Diagnosis not present

## 2022-12-20 DIAGNOSIS — E559 Vitamin D deficiency, unspecified: Secondary | ICD-10-CM | POA: Diagnosis not present

## 2022-12-20 DIAGNOSIS — Z1389 Encounter for screening for other disorder: Secondary | ICD-10-CM | POA: Diagnosis not present

## 2022-12-20 DIAGNOSIS — Z0001 Encounter for general adult medical examination with abnormal findings: Secondary | ICD-10-CM | POA: Diagnosis not present

## 2022-12-27 DIAGNOSIS — Z1339 Encounter for screening examination for other mental health and behavioral disorders: Secondary | ICD-10-CM | POA: Diagnosis not present

## 2022-12-27 DIAGNOSIS — Z1331 Encounter for screening for depression: Secondary | ICD-10-CM | POA: Diagnosis not present

## 2022-12-27 DIAGNOSIS — Z23 Encounter for immunization: Secondary | ICD-10-CM | POA: Diagnosis not present

## 2022-12-27 DIAGNOSIS — Z0000 Encounter for general adult medical examination without abnormal findings: Secondary | ICD-10-CM | POA: Diagnosis not present

## 2023-01-14 DIAGNOSIS — H5213 Myopia, bilateral: Secondary | ICD-10-CM | POA: Diagnosis not present

## 2023-01-15 DIAGNOSIS — F900 Attention-deficit hyperactivity disorder, predominantly inattentive type: Secondary | ICD-10-CM | POA: Diagnosis not present

## 2023-09-17 ENCOUNTER — Encounter: Payer: Self-pay | Admitting: Gastroenterology

## 2023-10-11 ENCOUNTER — Other Ambulatory Visit: Payer: Self-pay

## 2023-10-11 ENCOUNTER — Telehealth: Payer: Self-pay | Admitting: *Deleted

## 2023-10-11 ENCOUNTER — Encounter: Payer: Self-pay | Admitting: Gastroenterology

## 2023-10-11 ENCOUNTER — Ambulatory Visit (AMBULATORY_SURGERY_CENTER): Payer: Self-pay

## 2023-10-11 VITALS — Ht 70.0 in | Wt 160.0 lb

## 2023-10-11 DIAGNOSIS — Z1211 Encounter for screening for malignant neoplasm of colon: Secondary | ICD-10-CM

## 2023-10-11 MED ORDER — NA SULFATE-K SULFATE-MG SULF 17.5-3.13-1.6 GM/177ML PO SOLN: 1.0000 | Freq: Once | ORAL | 0 refills | Status: AC

## 2023-10-11 NOTE — Telephone Encounter (Signed)
 Patient rescheduled for in person appointment today

## 2023-10-11 NOTE — Progress Notes (Signed)
 Denies allergies to eggs or soy products. Denies complication of anesthesia or sedation. Denies use of weight loss medication. Denies use of O2.   Emmi instructions given for colonoscopy.

## 2023-10-11 NOTE — Telephone Encounter (Signed)
 Attempting to reach patient for telephone PV x 2. LVM to please call and reschedule PV to avoid cancellation of colonoscopy.

## 2023-10-22 ENCOUNTER — Ambulatory Visit (AMBULATORY_SURGERY_CENTER): Admitting: Gastroenterology

## 2023-10-22 ENCOUNTER — Encounter: Payer: Self-pay | Admitting: Gastroenterology

## 2023-10-22 VITALS — BP 137/84 | HR 71 | Temp 98.1°F | Resp 18 | Ht 69.5 in | Wt 160.0 lb

## 2023-10-22 DIAGNOSIS — Z1211 Encounter for screening for malignant neoplasm of colon: Secondary | ICD-10-CM | POA: Diagnosis not present

## 2023-10-22 MED ORDER — SODIUM CHLORIDE 0.9 % IV SOLN: 500.0000 mL | INTRAVENOUS | Status: DC

## 2023-10-22 NOTE — Op Note (Signed)
 [name] Procedure Date: 10/22/2023 8:34 AM MRN: [id number] Endoscopist: Victory L. Legrand , MD, [id number] Age: 48 Referring MD:  Date of Birth: May 20, 1975 Gender: Female Account #: [id number] Procedure:                Colonoscopy Indications:              Screening for colorectal malignant neoplasm, This                            is the patient's first colonoscopy Medicines:                Monitored Anesthesia Care Procedure:                Pre-Anesthesia Assessment:                           - Prior to the procedure, a History and Physical                            was performed, and patient medications and                            allergies were reviewed. The patient's tolerance of                            previous anesthesia was also reviewed. The risks                            and benefits of the procedure and the sedation                            options and risks were discussed with the patient.                            All questions were answered, and informed consent                            was obtained. Prior Anticoagulants: The patient has                            taken no anticoagulant or antiplatelet agents. ASA                            Grade Assessment: II - A patient with mild systemic                            disease. After reviewing the risks and benefits,                            the patient was deemed in satisfactory condition to                            undergo the procedure.  After obtaining informed consent, the colonoscope                            was passed under direct vision. Throughout the                            procedure, the patient's blood pressure, pulse, and                            oxygen saturations were monitored continuously. The                            CF [id number] #[id number] was introduced through the anus                            and advanced to the  the cecum, identified by                            appendiceal orifice and ileocecal valve. The                            colonoscopy was performed without difficulty. The                            patient tolerated the procedure well. The quality                            of the bowel preparation was excellent. The                            ileocecal valve, appendiceal orifice, and rectum                            were photographed. The bowel preparation used was                            SUPREP. Scope In: 8:50:06 AM Scope Out: 9:04:00 AM Scope Withdrawal Time: 0 hours 10 minutes 54 seconds  Total Procedure Duration: 0 hours 13 minutes 54 seconds  Findings:                 The perianal and digital rectal examinations were                            normal.                           Repeat examination of right colon under NBI                            performed.                           The entire examined colon appeared normal on direct  and retroflexion views. Complications:            No immediate complications. Estimated Blood Loss:     Estimated blood loss: none. Impression:               - The entire examined colon is normal on direct and                            retroflexion views.                           - No specimens collected. Recommendation:           - Patient has a contact number available for                            emergencies. The signs and symptoms of potential                            delayed complications were discussed with the                            patient. Return to normal activities tomorrow.                            Written discharge instructions were provided to the                            patient.                           - Resume previous diet.                           - Continue present medications.                           - Repeat colonoscopy in 10 years for screening                             purposes. Anayi Bricco L. Legrand, MD 10/22/2023 9:07:38 AM This report has been signed electronically.

## 2023-10-22 NOTE — Patient Instructions (Signed)
   Continue previous diet & medications   YOU HAD AN ENDOSCOPIC PROCEDURE TODAY AT THE [facility]:   Refer to the procedure report that was given to you for any specific questions about what was found during the examination.  If the procedure report does not answer your questions, please call your gastroenterologist to clarify.  If you requested that your care partner not be given the details of your procedure findings, then the procedure report has been included in a sealed envelope for you to review at your convenience later.  YOU SHOULD EXPECT: Some feelings of bloating in the abdomen. Passage of more gas than usual.  Walking can help get rid of the air that was put into your GI tract during the procedure and reduce the bloating. If you had a lower endoscopy (such as a colonoscopy or flexible sigmoidoscopy) you may notice spotting of blood in your stool or on the toilet paper. If you underwent a bowel prep for your procedure, you may not have a normal bowel movement for a few days.  Please Note:  You might notice some irritation and congestion in your nose or some drainage.  This is from the oxygen used during your procedure.  There is no need for concern and it should clear up in a day or so.  SYMPTOMS TO REPORT IMMEDIATELY:  Following lower endoscopy (colonoscopy or flexible sigmoidoscopy):  Excessive amounts of blood in the stool  Significant tenderness or worsening of abdominal pains  Swelling of the abdomen that is new, acute  Fever of 100F or higher   For urgent or emergent issues, a gastroenterologist can be reached at any hour by calling [phone]. Do not use MyChart messaging for urgent concerns.    DIET:  We do recommend a small meal at first, but then you may proceed to your regular diet.  Drink plenty of fluids but you should avoid alcoholic beverages for 24 hours.  ACTIVITY:  You should plan to take it easy for the rest of today and you should NOT DRIVE or  use heavy machinery until tomorrow (because of the sedation medicines used during the test).    FOLLOW UP: Our staff will call the number listed on your records the next business day following your procedure.  We will call around 7:15- 8:00 am to check on you and address any questions or concerns that you may have regarding the information given to you following your procedure. If we do not reach you, we will leave a message.     If any biopsies were taken you will be contacted by phone or by letter within the next 1-3 weeks.  Please call us at [phone] if you have not heard about the biopsies in 3 weeks.    SIGNATURES/CONFIDENTIALITY: You and/or your care partner have signed paperwork which will be entered into your electronic medical record.  These signatures attest to the fact that that the information above on your After Visit Summary has been reviewed and is understood.  Full responsibility of the confidentiality of this discharge information lies with you and/or your care-partner.

## 2023-10-22 NOTE — Progress Notes (Signed)
 Pt's states no medical or surgical changes since previsit or office visit.

## 2023-10-22 NOTE — Progress Notes (Signed)
 History and Physical:  This patient presents for endoscopic testing for: Encounter Diagnosis  Name Primary?   Special screening for malignant neoplasms, colon Yes    Average risk for colorectal cancer.  1st screening exam.  Patient denies chronic abdominal pain, rectal bleeding, constipation or diarrhea.   Patient is otherwise without complaints or active issues today.   Past Medical History: Past Medical History:  Diagnosis Date   Acute pulmonary embolus (HCC) 02/27/2003   Allergy    AMA (advanced maternal age) multigravida 35+    Anxiety    Arthritis    Clotting disorder (HCC)    Condyloma acuminatum    Depression    External hemorrhoids without mention of complication    GERD (gastroesophageal reflux disease)    Headache(784.0)    Hypertension    MTHFR mutation (methylenetetrahydrofolate reductase)    Postpartum care following vaginal delivery (5/6) 07/02/2013     Past Surgical History: Past Surgical History:  Procedure Laterality Date   WISDOM TOOTH EXTRACTION Bilateral     Allergies: No Known Allergies  Outpatient Meds: Current Outpatient Medications  Medication Sig Dispense Refill   buPROPion (ZYBAN) 150 MG 12 hr tablet Take 150 mg by mouth 2 (two) times daily.     escitalopram (LEXAPRO) 10 MG tablet Take 10 mg by mouth daily.     escitalopram (LEXAPRO) 20 MG tablet Take 20 mg by mouth daily.     fluticasone (FLONASE) 50 MCG/ACT nasal spray SPRAY 2 SPRAYS INTO EACH NOSTRIL EVERY DAY     butalbital-acetaminophen -caffeine (FIORICET, ESGIC) 50-325-40 MG per tablet Take 1 tablet by mouth 2 (two) times daily as needed for headache.     Current Facility-Administered Medications  Medication Dose Route Frequency Provider Last Rate Last Admin   0.9 %  sodium chloride  infusion  500 mL Intravenous Continuous Legrand Victory CROME III, MD          ___________________________________________________________________ Objective   Exam:  BP (!) 150/95   Pulse 88   Temp  98.1 F (36.7 C) (Temporal)   Ht 5' 9.5 (1.765 m)   Wt 160 lb (72.6 kg)   LMP 09/27/2023   SpO2 98%   BMI 23.29 kg/m   CV: regular , S1/S2 Resp: clear to auscultation bilaterally, normal RR and effort noted GI: soft, no tenderness, with active bowel sounds.   Assessment: Encounter Diagnosis  Name Primary?   Special screening for malignant neoplasms, colon Yes     Plan: Colonoscopy   The benefits and risks of the planned procedure(s) were described in detail with the patient or (when appropriate) their health care proxy.  Risks were outlined as including, but not limited to, bleeding, infection, perforation, adverse medication reaction leading to cardiac or pulmonary decompensation, pancreatitis (if ERCP).  The limitation of incomplete mucosal visualization was also discussed.  No guarantees or warranties were given.  The patient is appropriate for an endoscopic procedure in the ambulatory setting.   - Victory Legrand, MD

## 2023-10-22 NOTE — Progress Notes (Signed)
 A/o x 3, VSS, gd SR's, pleased with anesthesia, report to RN

## 2023-10-23 ENCOUNTER — Telehealth: Payer: Self-pay

## 2023-10-23 NOTE — Telephone Encounter (Signed)
 Left message on follow up call.
